# Patient Record
Sex: Male | Born: 1935 | Race: White | Hispanic: No | Marital: Married | Smoking: Never smoker
Health system: Southern US, Community
[De-identification: ages and names within clinical notes are randomized; demographics above are authoritative.]

## PROBLEM LIST (undated history)

## (undated) DIAGNOSIS — I639 Cerebral infarction, unspecified: Secondary | ICD-10-CM

## (undated) DIAGNOSIS — L570 Actinic keratosis: Secondary | ICD-10-CM

## (undated) HISTORY — DX: Actinic keratosis: L57.0

## (undated) HISTORY — DX: Cerebral infarction, unspecified: I63.9

## (undated) HISTORY — PX: PROSTATE SURGERY: SHX751

---

## 2007-11-21 DIAGNOSIS — C4492 Squamous cell carcinoma of skin, unspecified: Secondary | ICD-10-CM

## 2007-11-21 HISTORY — DX: Squamous cell carcinoma of skin, unspecified: C44.92

## 2009-12-08 ENCOUNTER — Observation Stay: Payer: Self-pay | Admitting: Internal Medicine

## 2010-02-05 ENCOUNTER — Ambulatory Visit: Payer: Self-pay | Admitting: Ophthalmology

## 2010-02-25 ENCOUNTER — Ambulatory Visit: Payer: Self-pay | Admitting: Ophthalmology

## 2013-06-06 ENCOUNTER — Ambulatory Visit: Payer: Self-pay | Admitting: Internal Medicine

## 2014-10-15 ENCOUNTER — Ambulatory Visit
Admission: RE | Admit: 2014-10-15 | Discharge: 2014-10-15 | Disposition: A | Discharge status: Home / Self Care | Payer: Medicare HMO | Source: Ambulatory Visit | Attending: Internal Medicine | Admitting: Internal Medicine

## 2014-10-15 ENCOUNTER — Other Ambulatory Visit: Payer: Self-pay | Admitting: Internal Medicine

## 2014-10-15 DIAGNOSIS — M542 Cervicalgia: Secondary | ICD-10-CM

## 2014-10-23 ENCOUNTER — Ambulatory Visit: Payer: Medicare HMO | Attending: Internal Medicine | Admitting: Physical Therapy

## 2014-10-23 ENCOUNTER — Encounter: Payer: Self-pay | Admitting: Physical Therapy

## 2014-10-23 DIAGNOSIS — M542 Cervicalgia: Secondary | ICD-10-CM | POA: Insufficient documentation

## 2014-10-23 DIAGNOSIS — M545 Low back pain, unspecified: Secondary | ICD-10-CM

## 2014-10-23 NOTE — Therapy (Signed)
[place] MAIN [facility] SERVICES [address] [place], [place], [address] Phone: [phone]   Fax:  [phone]  Physical Therapy Evaluation  Patient Details  Name: Chase George MRN: [id number] Date of Birth: Jun 06, 1933 Referring Provider:  Albina Billet, MD  Encounter Date: 10/23/2014      PT End of Session - 10/23/14 1116    Visit Number 1   Number of Visits 13   Date for PT Re-Evaluation 12/18/14   Authorization Type 1/10   PT Start Time 1000   PT Stop Time 1100   PT Time Calculation (min) 60 min   Activity Tolerance Patient tolerated treatment well   Behavior During Therapy [facility] for tasks assessed/performed      Past Medical History  Diagnosis Date  . Stroke 10 years ago    Mini stroke (TIA) no deficits    Past Surgical History  Procedure Laterality Date  . Prostate surgery      There were no vitals filed for this visit.  Visit Diagnosis:  Cervicalgia - Plan: PT plan of care cert/re-cert  Left-sided low back pain without sciatica - Plan: PT plan of care cert/re-cert  Neck pain on left side - Plan: PT plan of care cert/re-cert      Subjective Assessment - 10/23/14 1004    Subjective Patient reports he began feeling pain in his posterior flank on the left side, which has gradually elevated to his left shoulder and neck. His chief complaint now is that he is unable to turn his head. He states this has been going on for roughly 2 months, with no identifiable mechanism of injury.    Patient Stated Goals To be able to turn his head pain free.    Currently in Pain? Yes   Pain Score 5    Pain Location Neck   Pain Orientation Left   Pain Onset More than a month ago   Pain Frequency Intermittent   Aggravating Factors  Turning his head    Pain Relieving Factors Pain medications (ibuprofen)             [facility] PT Assessment - 10/23/14 1032    Assessment   Medical Diagnosis Cervical strain   Onset Date/Surgical Date 08/23/14    Hand Dominance Right   Precautions   Precautions None   Restrictions   Weight Bearing Restrictions No   Balance Screen   Has the patient fallen in the past 6 months No   Has the patient had a decrease in activity level because of a fear of falling?  No   Is the patient reluctant to leave their home because of a fear of falling?  No   Prior Function   Level of Independence Independent   Cognition   Overall Cognitive Status Within Functional Limits for tasks assessed   Observation/Other Assessments   Quick DASH  20  2 for all 4 responses on work module   Posture/Postural Control   Posture Comments Kyphotic cervical spine    Palpation   Spinal mobility --  Decreased give in t and c-spines on mobilizations   Palpation comment --  Tautness in cervical posterior spinal musculature   Special Tests    Special Tests Cervical   Cervical Tests Spurling's;Dictraction   Spurling's   Findings Negative   Distraction Test   Comment Felt better afterwards     There- Ex  Initially patient described pain with cervical flexion/extension and was limited by roughly 25% in ROM. Cervical  rotation patient described at end ranges, and was limited by roughly 25%, rotating to the left was more restricted and painful than rotating to the right.  Lateral glides applied bilaterally grade I-II mobilizations provided and well tolerated  1st rib mobilizations grade I-II provided bilaterally and well tolerated  Thoracic and cervical P-A mobilizations grade I-II, well tolerated  Red-theraband mid-rows x 12 with cuing for pinching his shoulder blades together.  Cervical distractions grade II-III provided in neutral and with side bending bilaterally, well tolerated  Soft tissue mobilization provided to the posterior cervical musculature, well tolerated  Active SCM stretching x 8 bilaterally with 5 second holds  Scalene stretching x 10 bilaterally with 5 second holds  Flexion with overpressure of cervical  spine x 10 with 3 second holds  Manual overpressure x 8 bilaterally with PNF stretching into cervical rotation, well tolerated Educated patient on squatting/lifting technique                       PT Education - 10/23/14 1116    Education provided Yes   Education Details Patient educated that he most likely has a cervical sprain and provided HEP and importance of compliance with HEP.    Person(s) Educated Patient   Methods Explanation;Demonstration;Handout   Comprehension Verbalized understanding;Returned demonstration             PT Long Term Goals - 10/23/14 1129    PT LONG TERM GOAL #1   Title Patient will be independent with a HEP to address postural weakness, increase ROM, and decrease pain by 12/18/2014.   Status New   PT LONG TERM GOAL #2   Title Patient will report a decrease in QuickDash score by at least 9 points to indicate increased function and decreased pain by 12/18/2014.    Baseline Score of 20   Status New   PT LONG TERM GOAL #3   Title Patient will display full AROM of cervical flexion/extension and rotation with no increase in pain to return to household activities by 12/18/2014.   Baseline AROM reudced 25% and painful at end ranges.   Status New   PT LONG TERM GOAL #4   Title Patient will report a worst VAS pain score of 1/10 to display increased activity tolerance for household activities by 12/18/2014.   Baseline 5/10   Status New               Plan - 10/23/14 1122    Clinical Impression Statement Patient is a 81 y/o male that presents with left sided cervical pain of insidious onset roughly 2 months ago. Patient displays kyphotic neck posture, poor lifting techniques, and significant tautness in her left posterior cervical musculature and upper trapezius. Patient responded well to manual techniques and soft tissue mobilization, increasing his ROM by roughly 25% in rotation and flexion/extension. He states he did not feel any pain at  the end of the session except with rotations. Patient had a Negative Spurling's test, and given his overall presentation this appears to be a muscular/fascial set of restrictions/overuse and should respond well to postural strengthening and manual techniques. Skilled PT services are indicated at this time to address the listed defiicts.    Pt will benefit from skilled therapeutic intervention in order to improve on the following deficits Pain;Decreased mobility;Improper body mechanics;Decreased range of motion   Rehab Potential Good   Clinical Impairments Affecting Rehab Potential Kyphotic cervical spine    PT Frequency 2x / week  PT Duration 6 weeks   PT Treatment/Interventions ADLs/Self Care Home Management;Traction;Dry needling;Taping;Therapeutic exercise;Therapeutic activities;Manual techniques   PT Next Visit Plan Re-assess HEP, continue with manual techniques if painful, assess low back pain, provide education on squatting and lifting techniques.    PT Home Exercise Plan See in patient instructions    Consulted and Agree with Plan of Care Patient          G-Codes - 2014-11-11 1121    Functional Assessment Tool Used QuickDash, clinical judgement    Functional Limitation Changing and maintaining body position   Changing and Maintaining Body Position Current Status (K3276) At least 1 percent but less than 20 percent impaired, limited or restricted   Changing and Maintaining Body Position Goal Status (D4709) 0 percent impaired, limited or restricted       Problem List There are no active problems to display for this patient.  Kerman Passey, PT, DPT   11/11/2014, 11:35 AM  [place] MAIN [facility] SERVICES [address] [place], [place], [address] Phone: [phone]   Fax:  [phone]

## 2014-10-23 NOTE — Patient Instructions (Signed)
All exercises provided were adapted from [email or web address]. Patient was provided a written handout with pictures as described. Any additional cues were manually entered in to handout and copied in to this document.   3 WAY SCAPULAR ROW - MIDDLE (12 times, 3 second holds, 2 sets, 2x per day)   Tie a knot in the elastic band and close the knot in the door jam. Perform this exercise with the elastic band at elbow level.  Hold an elastic band with both arms in front of you. Next, pull the band back towards your side as you bend your elbows.    Lateral Posterior Neck Stretch (10 times, 3 second holds, 1 set, 2x per day)   Place hand out on bed or chair. Look at arm armpit and place opposite hand on head and apply slight overpressure.   Neck Self Mobilization (10 times, 5 second holds, 2 sets, 2x per day)   - Sit upright, in good thoracic and cervical alignment - Place the edge of a towel near the most stiff and painful part of your neck - Use one hand to stabilize the towel down along your chest, with your other hand pull the towel forward and upward causing the neck to rotate in and the other to rotate you neck into the direction that is most restricted,  having the hand pulling the towel guide the motion - Watch out for trunk rotation that might occur as an incorrect substitution pattern

## 2014-10-29 ENCOUNTER — Ambulatory Visit: Payer: Medicare HMO | Admitting: Physical Therapy

## 2014-10-29 ENCOUNTER — Encounter: Payer: Self-pay | Admitting: Physical Therapy

## 2014-10-29 DIAGNOSIS — M545 Low back pain, unspecified: Secondary | ICD-10-CM

## 2014-10-29 DIAGNOSIS — M542 Cervicalgia: Secondary | ICD-10-CM | POA: Diagnosis not present

## 2014-10-29 NOTE — Therapy (Signed)
[place] MAIN [facility] SERVICES [address] [place], [place], [address] Phone: [phone]   Fax:  [phone]  Physical Therapy Treatment  Patient Details  Name: Chase George MRN: [id number] Date of Birth: [birth year] Referring Provider:  Albina Billet, MD  Encounter Date: 10/29/2014      PT End of Session - 10/29/14 1435    Visit Number 2   Number of Visits 13   Date for PT Re-Evaluation 12/18/14   Authorization Type 2/10    PT Start Time 1348   PT Stop Time 1430   PT Time Calculation (min) 42 min   Activity Tolerance Patient tolerated treatment well   Behavior During Therapy [facility] for tasks assessed/performed      Past Medical History  Diagnosis Date  . Stroke 10 years ago    Mini stroke (TIA) no deficits    Past Surgical History  Procedure Laterality Date  . Prostate surgery      There were no vitals filed for this visit.  Visit Diagnosis:  Cervicalgia  Left-sided low back pain without sciatica  Neck pain on left side      Subjective Assessment - 10/29/14 1350    Subjective Patient reports that he has had intermittent improvements with his symptoms. He is afraid he has not been able to complete his HEP appropriately. He continues to complain of neck pain and reduced AROM.    Limitations --  Turning his head while driving   Patient Stated Goals To be able to turn his head pain free.    Currently in Pain? Yes   Pain Score 6    Pain Location Neck   Pain Orientation Medial   Pain Descriptors / Indicators Aching   Pain Type Chronic pain   Pain Onset More than a month ago   Pain Frequency Intermittent   Aggravating Factors  Turning his head    Pain Relieving Factors Pain medications (ibuprofen)    Effect of Pain on Daily Activities Makes it difficult for him to drive       Manual therapy  Cervical distractions provided grade I-II in straight line plane and with side bending bilaterally with mild relief of symptoms   Lateral to medial grade II mobilizations provided to cervical spine in supine  Soft tissue mobilization provided to levator scapulae and cervical spine with relief of pain, though minimal.  1st rib mobilization grade I-II provided bilaterally, with increase in ROM but no decrease in pain P-A mobilizations grade II-IV for pain/stiffness in lumbar, cervical, and thoracic spines with significant increase in ROM afterwards with side rotation.   There-Ex  Cervical retractions x 10 for 2 sets for 3 second holds (no decrease in pain SCM stretching x 12 bilaterally, increased ROM, though no decrease in pain  PNF overpressure to left rotation x 10 with 15 second holds, minimal change in symptoms Scalene stretching x 8 bilaterally                        PT Education - 10/29/14 1434    Education provided Yes   Education Details Patient educated to start using heat prior to exercises and to change his HEP to the one listed today.    Person(s) Educated Patient   Methods Explanation;Demonstration;Handout   Comprehension Verbalized understanding;Returned demonstration             PT Long Term Goals - 10/23/14 1129    PT LONG TERM GOAL #1  Title Patient will be independent with a HEP to address postural weakness, increase ROM, and decrease pain by 12/18/2014.   Status New   PT LONG TERM GOAL #2   Title Patient will report a decrease in QuickDash score by at least 9 points to indicate increased function and decreased pain by 12/18/2014.    Baseline Score of 20   Status New   PT LONG TERM GOAL #3   Title Patient will display full AROM of cervical flexion/extension and rotation with no increase in pain to return to household activities by 12/18/2014.   Baseline AROM reudced 25% and painful at end ranges.   Status New   PT LONG TERM GOAL #4   Title Patient will report a worst VAS pain score of 1/10 to display increased activity tolerance for household activities by 12/18/2014.    Baseline 5/10   Status New               Plan - 10/29/14 1436    Clinical Impression Statement Patient is able to move his head quickly through roughly 45 degrees of rotation bilaterally with no complaints, and is now experiencing pain only with end range rotation bilaterally. Patient is experiencing pain in the musculature between the transition of the thoracicc and cervical spine, in the area of the levator scapulae. Patient has no limitations in flexion/extension currently with no pain. Patient seems to respond better to manual techniques than exercise at this time, though patient did not seem to have any decrease in pain symptoms in his neck today. Patient instructed to discontinue towel exercise and instead try hot packs at home prior to beginning his stretches. Skilled PT services are indicated to continue to address his deficits in mobility.    Pt will benefit from skilled therapeutic intervention in order to improve on the following deficits Pain;Decreased mobility;Improper body mechanics;Decreased range of motion   Rehab Potential Good   Clinical Impairments Affecting Rehab Potential Kyphotic cervical spine    PT Frequency 2x / week   PT Duration 6 weeks   PT Treatment/Interventions ADLs/Self Care Home Management;Traction;Dry needling;Taping;Therapeutic exercise;Therapeutic activities;Manual techniques   PT Next Visit Plan Re-assess HEP, manual techniques if patient continues to be painful, provide education on squatting/lifting techniques.    PT Home Exercise Plan See in patient instructions    Consulted and Agree with Plan of Care Patient        Problem List There are no active problems to display for this patient.  Kerman Passey, PT, DPT   10/29/2014, 5:07 PM  [place] MAIN [facility] SERVICES [address] [place], [place], [address] Phone: [phone]   Fax:  [phone]

## 2014-10-31 ENCOUNTER — Ambulatory Visit: Payer: Medicare HMO | Admitting: Physical Therapy

## 2014-10-31 ENCOUNTER — Encounter: Payer: Self-pay | Admitting: Physical Therapy

## 2014-10-31 DIAGNOSIS — M542 Cervicalgia: Secondary | ICD-10-CM | POA: Diagnosis not present

## 2014-10-31 DIAGNOSIS — M545 Low back pain, unspecified: Secondary | ICD-10-CM

## 2014-11-01 NOTE — Therapy (Signed)
[place] MAIN [facility] SERVICES [address] [place], [place], [address] Phone: [phone]   Fax:  [phone]  Physical Therapy Treatment  Patient Details  Name: Chase George MRN: [id number] Date of Birth: 12/16/1933 Referring Provider:  Albina Billet, MD  Encounter Date: 10/31/2014      PT End of Session - 11/01/14 0804    Visit Number 3   Number of Visits 13   Date for PT Re-Evaluation 12/18/14   Authorization Type 3/10   PT Start Time 1039   PT Stop Time 1117   PT Time Calculation (min) 38 min   Activity Tolerance Patient tolerated treatment well   Behavior During Therapy [facility] for tasks assessed/performed      Past Medical History  Diagnosis Date  . Stroke 10 years ago    Mini stroke (TIA) no deficits    Past Surgical History  Procedure Laterality Date  . Prostate surgery      There were no vitals filed for this visit.  Visit Diagnosis:  Cervicalgia  Left-sided low back pain without sciatica  Neck pain on left side      Subjective Assessment - 10/31/14 1046    Subjective Patient states he has been compliant with his HEP and has noticed more ROM with rotation, but continues to complain of pain with end range cervical rotation.    Limitations --  Turning his head while driving.    Patient Stated Goals To be able to turn his head pain free.    Currently in Pain? No/denies  At rest no pain, he only experiences pain with end range rotation.        There-Ex  Instructed and observed patient in squat technique to pick up objects off of the floor x 8 repetitions. Patient required cuing for increased hip and knee flexion, and less lumbar flexion. Patient benefitted from cuing for more vertical trunk.    Manual Therapy 1st rib mobilization grade I-II bilaterally Soft tissue mobilization provided to levator scapulae and upper trapezius regions  Manual cervical distraction grade I-II with head in neutral and lateral flexion  bilaterally  Thoracic and c-spine mobilizations grade II-III, well tolerated  PNF manual over pressure in cervical rotations x 30 second holds x 3 bilaterally  Cervical retraction with PT overpressure grade I-II, well tolerated                            PT Education - 10/31/14 1203    Education provided Yes   Education Details Patient educated to start using hot pack and continue with HEP.    Person(s) Educated Patient   Methods Demonstration;Explanation   Comprehension Verbalized understanding;Returned demonstration             PT Long Term Goals - 10/23/14 1129    PT LONG TERM GOAL #1   Title Patient will be independent with a HEP to address postural weakness, increase ROM, and decrease pain by 12/18/2014.   Status New   PT LONG TERM GOAL #2   Title Patient will report a decrease in QuickDash score by at least 9 points to indicate increased function and decreased pain by 12/18/2014.    Baseline Score of 20   Status New   PT LONG TERM GOAL #3   Title Patient will display full AROM of cervical flexion/extension and rotation with no increase in pain to return to household activities by 12/18/2014.   Baseline AROM  reudced 25% and painful at end ranges.   Status New   PT LONG TERM GOAL #4   Title Patient will report a worst VAS pain score of 1/10 to display increased activity tolerance for household activities by 12/18/2014.   Baseline 5/10   Status New               Plan - 11/01/14 0805    Clinical Impression Statement Patient displays full AROM with all cervical motions, and complains of pain only at the end range of cervical rotations. Patient continues to state he gains more ROM durin gthe session, but has no change in the quality or intensity of his symptoms. PT may want to consider dry needling, or more treatments with his head in end range rotations. Skilled PT services are indicated to address his pain with turning his head while driving.    Pt  will benefit from skilled therapeutic intervention in order to improve on the following deficits Pain;Decreased mobility;Improper body mechanics;Decreased range of motion   Rehab Potential Good   Clinical Impairments Affecting Rehab Potential Kyphotic cervical spine    PT Frequency 2x / week   PT Duration 6 weeks   PT Treatment/Interventions ADLs/Self Care Home Management;Traction;Dry needling;Taping;Therapeutic exercise;Therapeutic activities;Manual techniques   PT Next Visit Plan Progress HEP, may want to begin more active exercises. Consider manual traction with head in rotations and/or soft tissue mobilization with head in rotation.    PT Home Exercise Plan Continued from previous sessions.    Consulted and Agree with Plan of Care Patient        Problem List There are no active problems to display for this patient.  Kerman Passey, PT, DPT   11/01/2014, 8:11 AM  [place] MAIN [facility] SERVICES [address] [place], [place], [address] Phone: [phone]   Fax:  [phone]

## 2014-11-05 ENCOUNTER — Ambulatory Visit: Payer: Medicare HMO | Admitting: Physical Therapy

## 2014-11-05 ENCOUNTER — Encounter: Payer: Self-pay | Admitting: Physical Therapy

## 2014-11-05 DIAGNOSIS — M542 Cervicalgia: Secondary | ICD-10-CM

## 2014-11-05 DIAGNOSIS — M545 Low back pain, unspecified: Secondary | ICD-10-CM

## 2014-11-05 NOTE — Therapy (Signed)
[place] MAIN [facility] SERVICES [address] [place], [place], [address] Phone: [phone]   Fax:  [phone]  Physical Therapy Treatment/ DC summary  Patient Details  Name: Chase George MRN: [id number] Date of Birth: January 31, 1934 Referring Provider:  Albina Billet, MD  Encounter Date: 2014-11-30      PT End of Session - 11-30-14 1135    PT Start Time 1100   PT Stop Time 1140   PT Time Calculation (min) 40 min      Past Medical History  Diagnosis Date  . Stroke 10 years ago    Mini stroke (TIA) no deficits    Past Surgical History  Procedure Laterality Date  . Prostate surgery      There were no vitals filed for this visit.  Visit Diagnosis:  Cervicalgia  Left-sided low back pain without sciatica  Neck pain on left side      Subjective Assessment - 11/30/14 1110    Subjective Patietnt says that most of his pain is goine today.       Cervical retractions x 10 for 2 sets for 3 second holds  SCM stretching x 12 bilaterally, increased ROM, though no decrease in pain  PNF overpressure to left rotation x 10 with 15 second holds, minimal change in symptoms Scalene stretching x 8 bilaterally  Manual therapy including manual traction to cervical spine x 8 minutes and PA glides grade 2 C 4,5,6 Patient reports no pain except in end ranges of rotation left and right.                                 PT Long Term Goals - 2014-11-30 1134    PT LONG TERM GOAL #1   Status Achieved   PT LONG TERM GOAL #2   Status Achieved   PT LONG TERM GOAL #3   Status Achieved   PT LONG TERM GOAL #4   Status Achieved               Plan - 30-Nov-2014 1110    Clinical Impression Statement Patient has AROM WFL and reports that most of his pain is goine. He will be DC from therapy and will continue HEP.Marland Kitchen   Pt will benefit from skilled therapeutic intervention in order to improve on the following deficits Pain;Decreased  mobility;Improper body mechanics;Decreased range of motion   Rehab Potential Good   Clinical Impairments Affecting Rehab Potential Kyphotic cervical spine    PT Frequency 2x / week   PT Duration 6 weeks   PT Treatment/Interventions ADLs/Self Care Home Management;Traction;Dry needling;Taping;Therapeutic exercise;Therapeutic activities;Manual techniques   PT Next Visit Plan Progress HEP, may want to begin more active exercises. Consider manual traction with head in rotations and/or soft tissue mobilization with head in rotation.    PT Home Exercise Plan Continued from previous sessions.    Consulted and Agree with Plan of Care Patient          G-Codes - November 30, 2014 1138    Changing and Maintaining Body Position Goal Status [phone]) 0 percent impaired, limited or restricted      Problem List There are no active problems to display for this patient.   Alanson Puls 11-30-2014, 11:39 AM  [place] MAIN [facility] SERVICES [address] [place], [place], [address] Phone: [phone]   Fax:  [phone]

## 2014-11-07 ENCOUNTER — Encounter: Payer: Medicare HMO | Admitting: Physical Therapy

## 2014-11-12 ENCOUNTER — Encounter: Payer: Medicare HMO | Admitting: Physical Therapy

## 2014-11-14 ENCOUNTER — Encounter: Payer: Medicare HMO | Admitting: Physical Therapy

## 2014-11-19 ENCOUNTER — Encounter: Payer: Medicare HMO | Admitting: Physical Therapy

## 2014-11-21 ENCOUNTER — Encounter: Payer: Medicare HMO | Admitting: Physical Therapy

## 2017-10-18 ENCOUNTER — Other Ambulatory Visit: Payer: Self-pay | Admitting: Internal Medicine

## 2017-10-18 ENCOUNTER — Ambulatory Visit
Admission: RE | Admit: 2017-10-18 | Discharge: 2017-10-18 | Disposition: A | Discharge status: Home / Self Care | Payer: Medicare HMO | Source: Ambulatory Visit | Attending: Internal Medicine | Admitting: Internal Medicine

## 2017-10-18 DIAGNOSIS — M25511 Pain in right shoulder: Secondary | ICD-10-CM

## 2017-10-18 DIAGNOSIS — R911 Solitary pulmonary nodule: Secondary | ICD-10-CM | POA: Insufficient documentation

## 2017-10-24 ENCOUNTER — Ambulatory Visit
Admission: RE | Admit: 2017-10-24 | Discharge: 2017-10-24 | Disposition: A | Discharge status: Home / Self Care | Payer: Medicare HMO | Source: Ambulatory Visit | Attending: Internal Medicine | Admitting: Internal Medicine

## 2017-10-24 ENCOUNTER — Other Ambulatory Visit: Payer: Self-pay | Admitting: Internal Medicine

## 2017-10-24 DIAGNOSIS — R911 Solitary pulmonary nodule: Secondary | ICD-10-CM | POA: Insufficient documentation

## 2019-02-06 IMAGING — DX DG SHOULDER 2+V*R*
4 series · 4 of 4 positions shown · non-contrast
Comparison: None.

CLINICAL DATA: Right shoulder pain for 3 weeks.

EXAM:
RIGHT SHOULDER - 2+ VIEW

[shoulder ap]
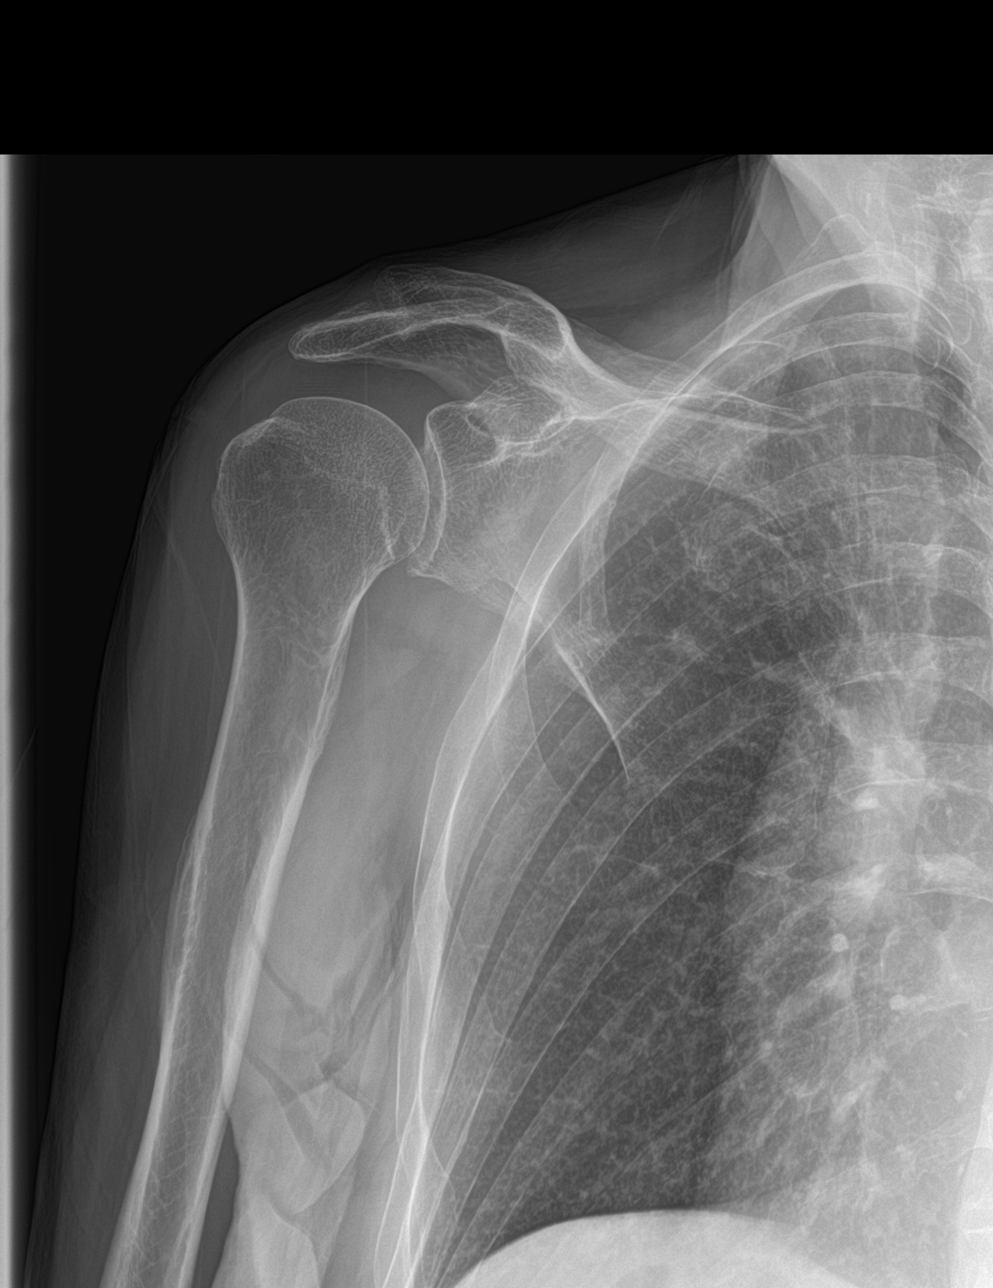

[shoulder y-view (1 of 2)]
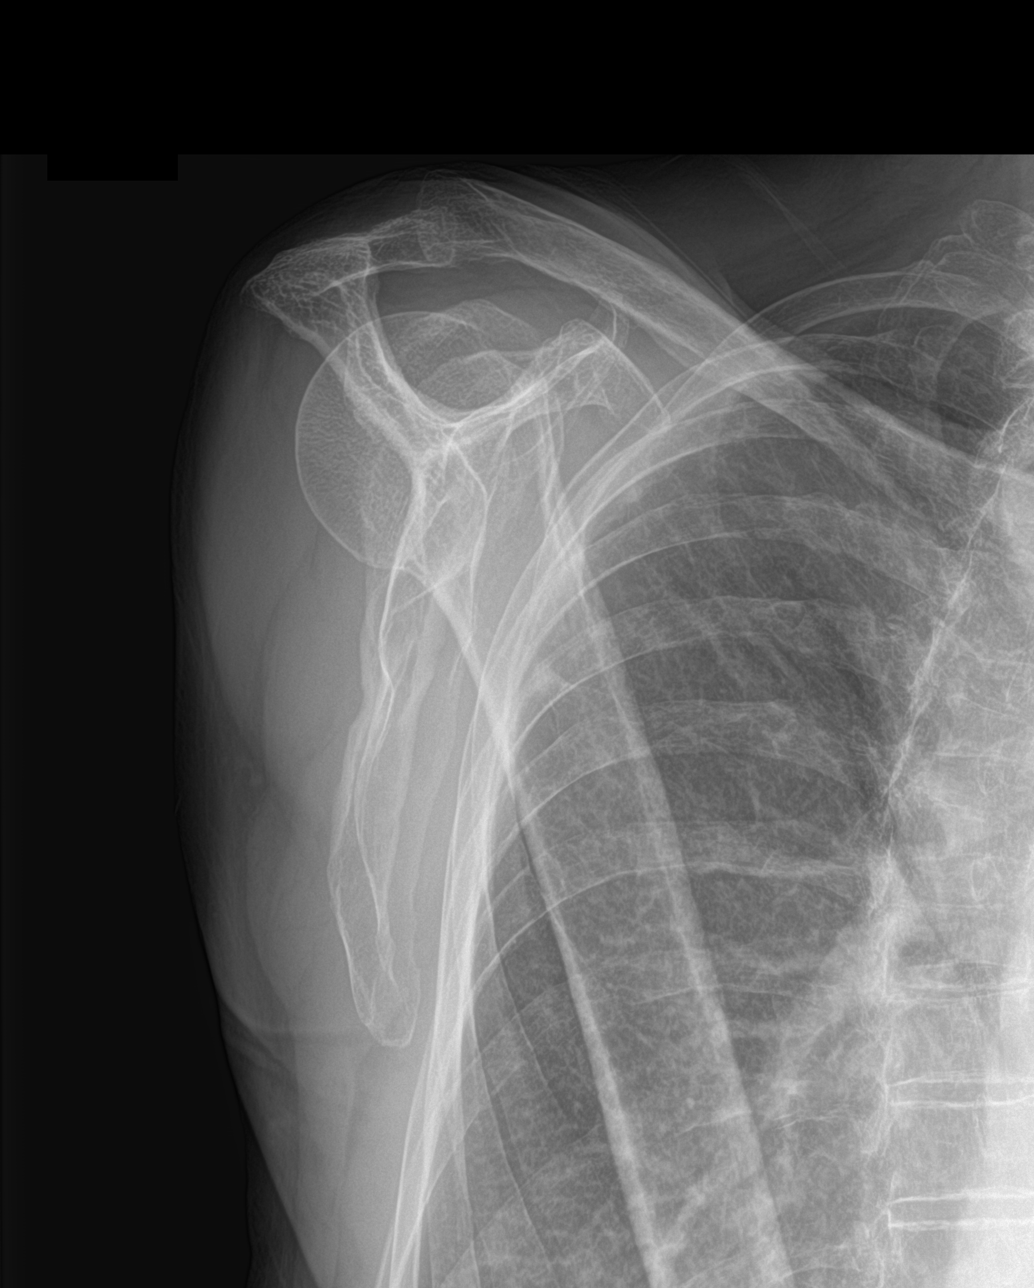

[shoulder axial]
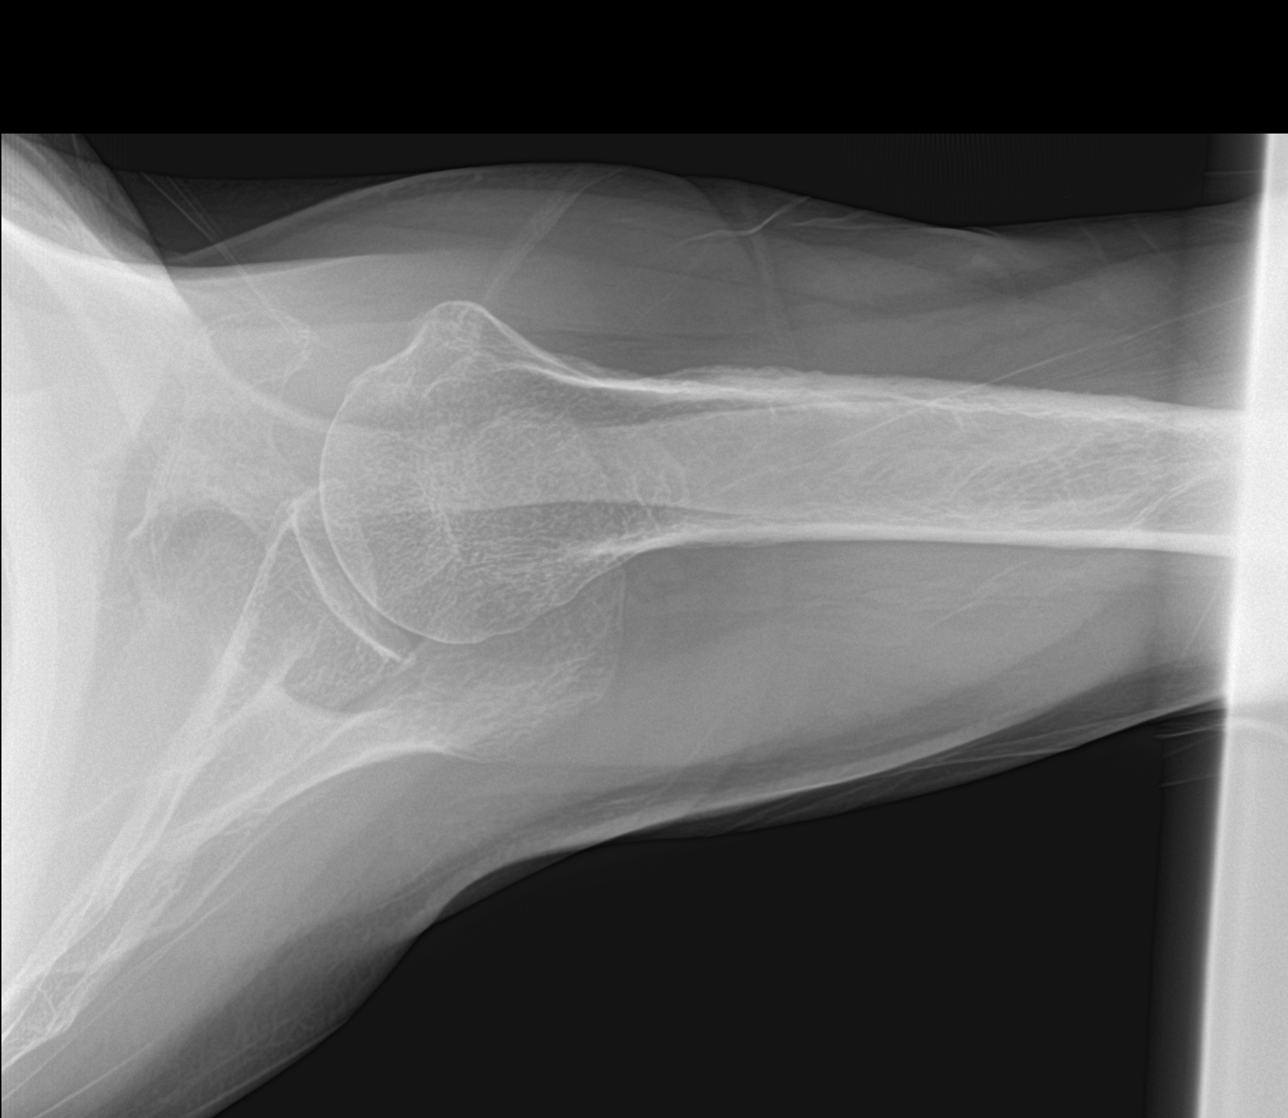

[shoulder y-view (2 of 2)]
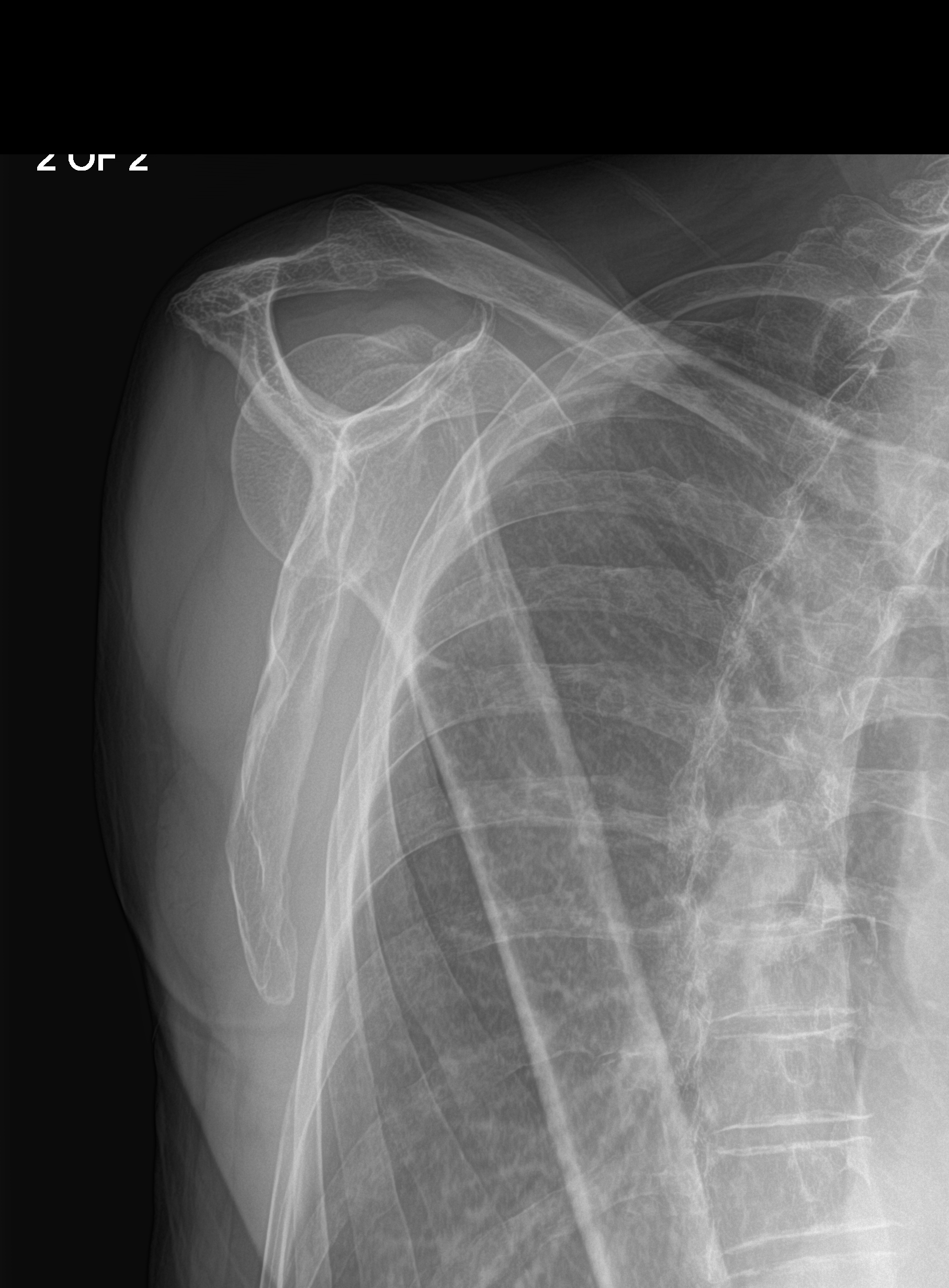

[4 of 4 positions shown; findings below may reference images not displayed]

FINDINGS: There is no evidence of fracture or dislocation. There is no
evidence of arthropathy or other focal bone abnormality. Soft
tissues are unremarkable.

Small pulmonary nodule identified in the right upper lobe.
IMPRESSION: 1. Small pulmonary nodule identified right upper lobe. CT chest
without contrast recommended to further evaluate.
2. No acute bony abnormality in the right shoulder.
3. These results will be called to the ordering clinician or
representative by the Radiologist Assistant, and communication
documented in the PACS or zVision Dashboard.

## 2019-02-12 IMAGING — DX DG CHEST 2V
3 series · 3 of 3 positions shown · non-contrast
Comparison: 12/08/2009.  Right shoulder series 10/18/2017

CLINICAL DATA: Right upper lobe nodule

EXAM:
CHEST - 2 VIEW

[chest pa (1 of 2)]
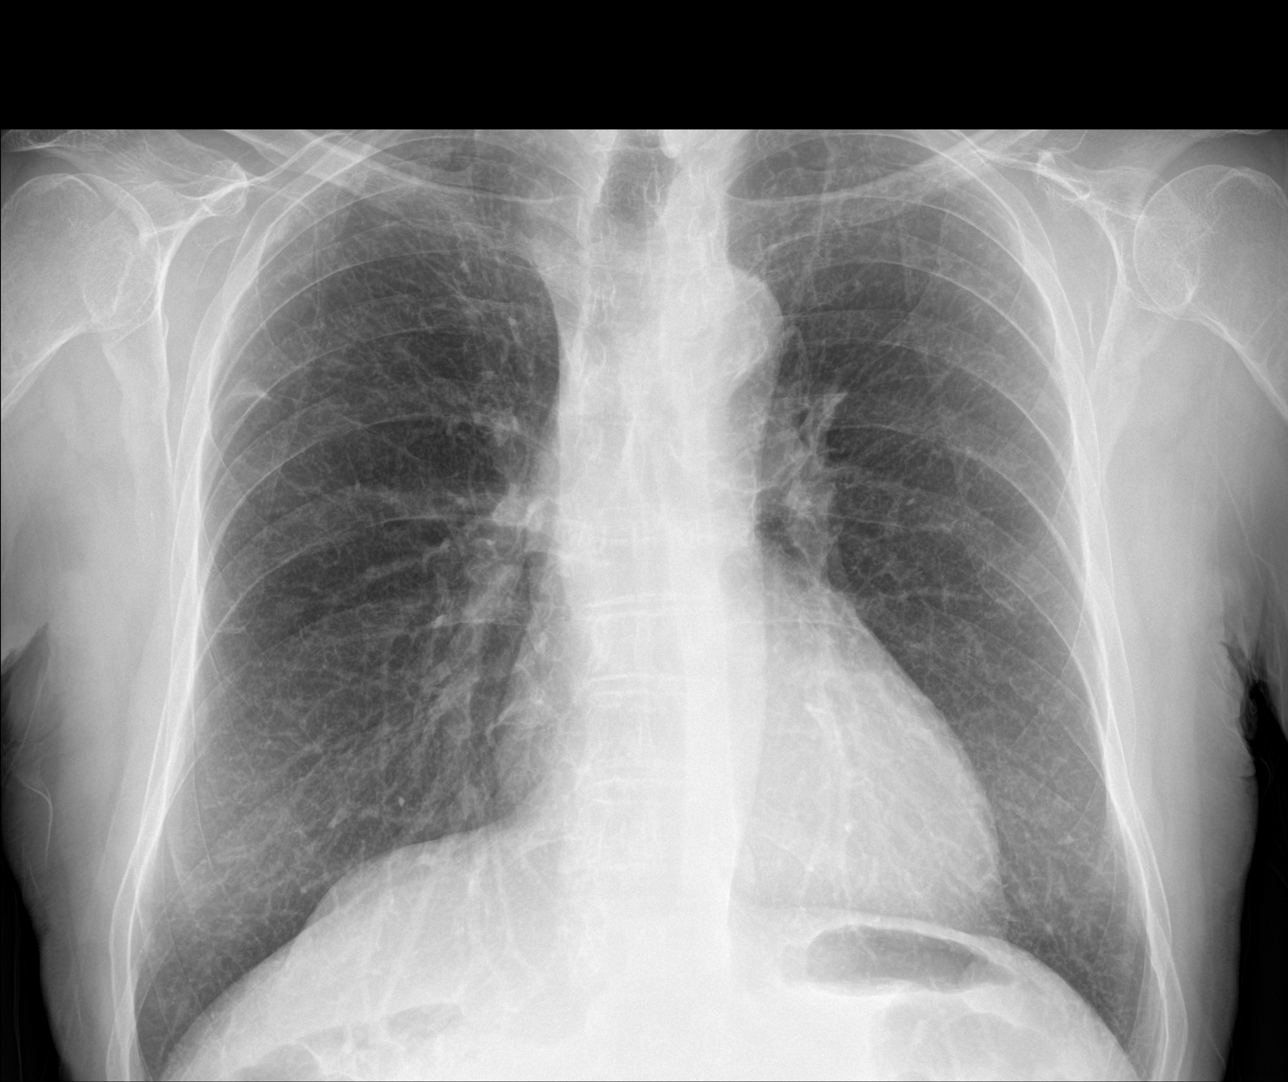

[chest lat]
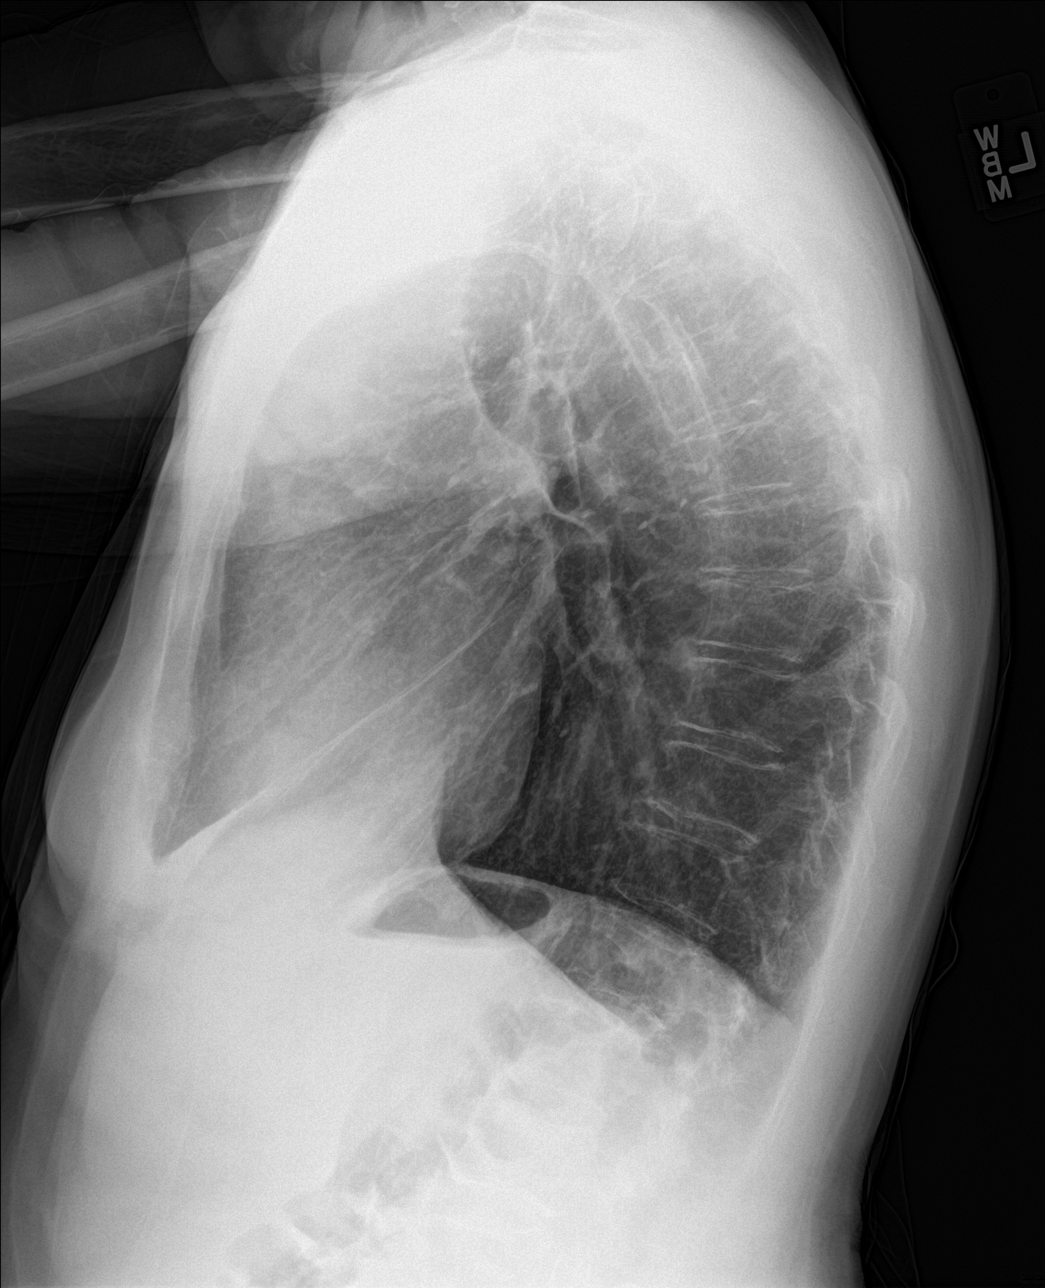

[chest pa (2 of 2)]
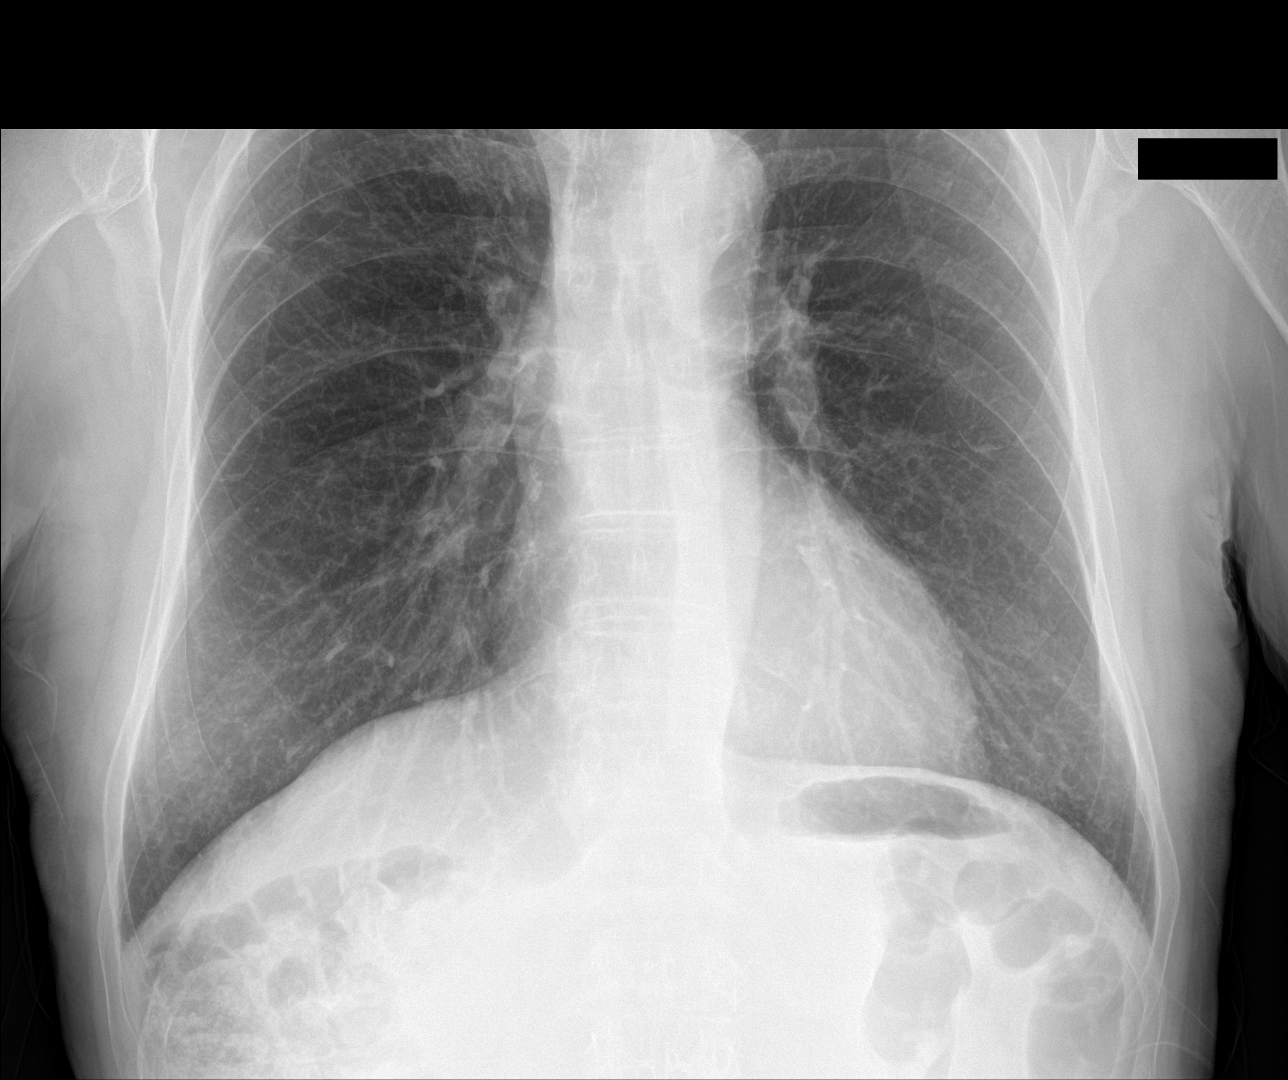

[3 of 3 positions shown; findings below may reference images not displayed]

FINDINGS: Small nodule noted peripherally in the right upper lobe is stable
since 9644, likely scarring. Heart is normal size. No confluent
airspace opacities or effusions. No acute bony abnormality.
IMPRESSION: Nodule in the right upper lobe again noted as seen on right shoulder
series, but appears stable dating back to 9644 most compatible with
scarring.

## 2019-10-29 ENCOUNTER — Other Ambulatory Visit: Payer: Self-pay

## 2019-10-29 ENCOUNTER — Ambulatory Visit (INDEPENDENT_AMBULATORY_CARE_PROVIDER_SITE_OTHER): Payer: Medicare HMO | Admitting: Dermatology

## 2019-10-29 DIAGNOSIS — D229 Melanocytic nevi, unspecified: Secondary | ICD-10-CM

## 2019-10-29 DIAGNOSIS — D692 Other nonthrombocytopenic purpura: Secondary | ICD-10-CM

## 2019-10-29 DIAGNOSIS — Z1283 Encounter for screening for malignant neoplasm of skin: Secondary | ICD-10-CM

## 2019-10-29 DIAGNOSIS — L720 Epidermal cyst: Secondary | ICD-10-CM | POA: Diagnosis not present

## 2019-10-29 DIAGNOSIS — L820 Inflamed seborrheic keratosis: Secondary | ICD-10-CM

## 2019-10-29 DIAGNOSIS — D1800 Hemangioma unspecified site: Secondary | ICD-10-CM | POA: Diagnosis not present

## 2019-10-29 DIAGNOSIS — L570 Actinic keratosis: Secondary | ICD-10-CM

## 2019-10-29 DIAGNOSIS — L814 Other melanin hyperpigmentation: Secondary | ICD-10-CM

## 2019-10-29 DIAGNOSIS — L578 Other skin changes due to chronic exposure to nonionizing radiation: Secondary | ICD-10-CM

## 2019-10-29 DIAGNOSIS — L821 Other seborrheic keratosis: Secondary | ICD-10-CM

## 2019-10-29 NOTE — Progress Notes (Addendum)
Follow-Up Visit   Subjective  Chase George is a 86 y.o. male who presents for the following: Annual Exam (UBSE - patient has noticed a tender pink patch on his right cheek that is sensitive when he shaves over it). The patient presents for Upper Body Skin Exam (UBSE) for skin cancer screening and mole check.  The following portions of the chart were reviewed this encounter and updated as appropriate:  Tobacco   Allergies   Meds   Problems   Med Hx   Surg Hx   Fam Hx       Review of Systems:  No other skin or systemic complaints except as noted in HPI or Assessment and Plan.  Objective  Well appearing patient in no apparent distress; mood and affect are within normal limits.  All skin waist up examined.  Objective  Face, scalp, and hands x 13 (13): Erythematous thin papules/macules with gritty scale.   Objective  Face x 3, hands x 2 (5): Erythematous keratotic or waxy stuck-on papule or plaque.   Objective  Glabella: Smooth white papule(s).   Assessment & Plan    AK (actinic keratosis) (13) Face, scalp, and hands x 13  Destruction of lesion - Face, scalp, and hands x 13 Complexity: simple   Destruction method: cryotherapy   Informed consent: discussed and consent obtained   Timeout:  patient name, date of birth, surgical site, and procedure verified Lesion destroyed using liquid nitrogen: Yes   Region frozen until ice ball extended beyond lesion: Yes   Outcome: patient tolerated procedure well with no complications   Post-procedure details: wound care instructions given    Inflamed seborrheic keratosis (5) Face x 3, hands x 2  Destruction of lesion - Face x 3, hands x 2 Complexity: simple   Destruction method: cryotherapy   Timeout:  patient name, date of birth, surgical site, and procedure verified Lesion destroyed using liquid nitrogen: Yes   Region frozen until ice ball extended beyond lesion: Yes   Outcome: patient tolerated procedure well with no  complications   Post-procedure details: wound care instructions given    Milia Glabella  Benign, observe.    Skin cancer screening   Lentigines - Scattered tan macules - Discussed due to sun exposure - Benign, observe - Call for any changes  Seborrheic Keratoses - Stuck-on, waxy, tan-brown papules and plaques  - Discussed benign etiology and prognosis. - Observe - Call for any changes  Melanocytic Nevi - Tan-brown and/or pink-flesh-colored symmetric macules and papules - Benign appearing on exam today - Observation - Call clinic for new or changing moles - Recommend daily use of broad spectrum spf 30+ sunscreen to sun-exposed areas.   Hemangiomas - Red papules - Discussed benign nature - Observe - Call for any changes  Actinic Damage - diffuse scaly erythematous macules with underlying dyspigmentation - Recommend daily broad spectrum sunscreen SPF 30+ to sun-exposed areas, reapply every 2 hours as needed.  - Call for new or changing lesions.  Purpura - Violaceous macules and patches - Benign - Related to age, sun damage and/or use of blood thinners - Observe - Can use OTC arnica containing moisturizer such as Dermend Bruise Formula if desired - Call for worsening or other concerns   Skin cancer screening performed today.   Return in about 6 months (around 04/29/2020) for F/U appt. - sun exposed areas.  [name], CMA, am acting as scribe for Sarina Ser, MD .  Documentation: I have reviewed the above  documentation for accuracy and completeness, and I agree with the above.  Sarina Ser, MD

## 2019-10-30 ENCOUNTER — Encounter: Payer: Self-pay | Admitting: Dermatology

## 2020-05-26 ENCOUNTER — Encounter: Payer: Medicare HMO | Admitting: Dermatology

## 2020-06-23 ENCOUNTER — Ambulatory Visit: Payer: Medicare HMO | Admitting: Dermatology

## 2020-06-23 ENCOUNTER — Other Ambulatory Visit: Payer: Self-pay

## 2020-06-23 DIAGNOSIS — L578 Other skin changes due to chronic exposure to nonionizing radiation: Secondary | ICD-10-CM | POA: Diagnosis not present

## 2020-06-23 DIAGNOSIS — L570 Actinic keratosis: Secondary | ICD-10-CM | POA: Diagnosis not present

## 2020-06-23 DIAGNOSIS — L820 Inflamed seborrheic keratosis: Secondary | ICD-10-CM

## 2020-06-23 DIAGNOSIS — L821 Other seborrheic keratosis: Secondary | ICD-10-CM

## 2020-06-23 NOTE — Progress Notes (Unsigned)
   Follow-Up Visit   Subjective  Chase George is a 87 y.o. male who presents for the following: Actinic Keratosis (Check sun exposed areas for new or persistent skin lesions).  The following portions of the chart were reviewed this encounter and updated as appropriate:   Tobacco  Allergies  Meds  Problems  Med Hx  Surg Hx  Fam Hx     Review of Systems:  No other skin or systemic complaints except as noted in HPI or Assessment and Plan.  Objective  Well appearing patient in no apparent distress; mood and affect are within normal limits.  A focused examination was performed including the face, scalp, and hands. Relevant physical exam findings are noted in the Assessment and Plan.  Objective  Sclap x 2, R mandible x 1, R zygoma x 1, L sideburn x 1 (5): Erythematous thin papules/macules with gritty scale.   Objective  R temple x 2 (2): Erythematous keratotic or waxy stuck-on papule or plaque.   Assessment & Plan  AK (actinic keratosis) (5) Sclap x 2, R mandible x 1, R zygoma x 1, L sideburn x 1  Destruction of lesion - Sclap x 2, R mandible x 1, R zygoma x 1, L sideburn x 1 Complexity: simple   Destruction method: cryotherapy   Informed consent: discussed and consent obtained   Timeout:  patient name, date of birth, surgical site, and procedure verified Lesion destroyed using liquid nitrogen: Yes   Region frozen until ice ball extended beyond lesion: Yes   Outcome: patient tolerated procedure well with no complications   Post-procedure details: wound care instructions given    Inflamed seborrheic keratosis (2) R temple x 2  Destruction of lesion - R temple x 2 Complexity: simple   Destruction method: cryotherapy   Informed consent: discussed and consent obtained   Timeout:  patient name, date of birth, surgical site, and procedure verified Lesion destroyed using liquid nitrogen: Yes   Region frozen until ice ball extended beyond lesion: Yes   Outcome: patient tolerated  procedure well with no complications   Post-procedure details: wound care instructions given     Actinic Damage - chronic, secondary to cumulative UV radiation exposure/sun exposure over time - diffuse scaly erythematous macules with underlying dyspigmentation - Recommend daily broad spectrum sunscreen SPF 30+ to sun-exposed areas, reapply every 2 hours as needed.  - Call for new or changing lesions.  Seborrheic Keratoses - Stuck-on, waxy, tan-brown papules and plaques  - Discussed benign etiology and prognosis. - Observe - Call for any changes  Return in about 6 months (around 12/21/2020) for AK follow up .  [name], CMA, am acting as scribe for Sarina Ser, MD .  Documentation: I have reviewed the above documentation for accuracy and completeness, and I agree with the above.  Sarina Ser, MD

## 2020-06-24 ENCOUNTER — Encounter: Payer: Self-pay | Admitting: Dermatology

## 2020-08-28 ENCOUNTER — Other Ambulatory Visit: Payer: Self-pay

## 2020-08-28 ENCOUNTER — Ambulatory Visit: Payer: Medicare HMO | Admitting: Dermatology

## 2020-08-28 DIAGNOSIS — L821 Other seborrheic keratosis: Secondary | ICD-10-CM

## 2020-08-28 DIAGNOSIS — R21 Rash and other nonspecific skin eruption: Secondary | ICD-10-CM | POA: Diagnosis not present

## 2020-08-28 DIAGNOSIS — L578 Other skin changes due to chronic exposure to nonionizing radiation: Secondary | ICD-10-CM | POA: Diagnosis not present

## 2020-08-28 DIAGNOSIS — L570 Actinic keratosis: Secondary | ICD-10-CM | POA: Diagnosis not present

## 2020-08-28 MED ORDER — CLOBETASOL PROPIONATE 0.05 % EX CREA: TOPICAL_CREAM | CUTANEOUS | 0 refills | Status: AC

## 2020-08-28 NOTE — Patient Instructions (Signed)

## 2020-08-28 NOTE — Progress Notes (Signed)
   Follow-Up Visit   Subjective  Chase George is a 87 y.o. male who presents for the following: Lesion (On the right arm - irritated and tender, patient is concerned and would it checked) and Rash (On the back that started about 3 weeks ago - patient c/o itching all over but especially on the back. He states he has had no changes in detergents, soaps, or medications).  The following portions of the chart were reviewed this encounter and updated as appropriate:   Tobacco  Allergies  Meds  Problems  Med Hx  Surg Hx  Fam Hx     Review of Systems:  No other skin or systemic complaints except as noted in HPI or Assessment and Plan.  Objective  Well appearing patient in no apparent distress; mood and affect are within normal limits.  A focused examination was performed including face, trunk, extremities. Relevant physical exam findings are noted in the Assessment and Plan.  Objective  Back, R arm: Edematous erythematous plaques and papules of the back.  Images    Objective  Face (16): Erythematous thin papules/macules with gritty scale.   Assessment & Plan  Rash and other nonspecific skin eruption Back, R arm  Contact dermatitis vs bite reaction vs other -   Start Clobetasol cream BID x 2 weeks. Topical steroids (such as triamcinolone, fluocinolone, fluocinonide, mometasone, clobetasol, halobetasol, betamethasone, hydrocortisone) can cause thinning and lightening of the skin if they are used for too long in the same area. Your physician has selected the right strength medicine for your problem and area affected on the body. Please use your medication only as directed by your physician to prevent side effects.   If rash doesn't improve at follow up recommend bx.  clobetasol cream (TEMOVATE) 0.05 % - Back, R arm  AK (actinic keratosis) (16) Face  Destruction of lesion - Face Complexity: simple   Destruction method: cryotherapy   Informed consent: discussed and consent  obtained   Timeout:  patient name, date of birth, surgical site, and procedure verified Lesion destroyed using liquid nitrogen: Yes   Region frozen until ice ball extended beyond lesion: Yes   Outcome: patient tolerated procedure well with no complications   Post-procedure details: wound care instructions given    Actinic Damage - chronic, secondary to cumulative UV radiation exposure/sun exposure over time - diffuse scaly erythematous macules with underlying dyspigmentation - Recommend daily broad spectrum sunscreen SPF 30+ to sun-exposed areas, reapply every 2 hours as needed.  - Recommend staying in the shade or wearing long sleeves, sun glasses (UVA+UVB protection) and wide brim hats (4-inch brim around the entire circumference of the hat). - Call for new or changing lesions.  Seborrheic Keratoses - Stuck-on, waxy, tan-brown papules and/or plaques  - Benign-appearing - Discussed benign etiology and prognosis. - Observe - Call for any changes  Return in about 2 weeks (around 09/11/2020) for rash follow up .  [name], CMA, am acting as scribe for Sarina Ser, MD .  Documentation: I have reviewed the above documentation for accuracy and completeness, and I agree with the above.  Sarina Ser, MD

## 2020-09-01 ENCOUNTER — Encounter: Payer: Self-pay | Admitting: Dermatology

## 2020-09-10 ENCOUNTER — Other Ambulatory Visit: Payer: Self-pay

## 2020-09-10 ENCOUNTER — Ambulatory Visit: Payer: Medicare HMO | Admitting: Dermatology

## 2020-09-10 DIAGNOSIS — C44622 Squamous cell carcinoma of skin of right upper limb, including shoulder: Secondary | ICD-10-CM | POA: Diagnosis not present

## 2020-09-10 DIAGNOSIS — R21 Rash and other nonspecific skin eruption: Secondary | ICD-10-CM

## 2020-09-10 DIAGNOSIS — L578 Other skin changes due to chronic exposure to nonionizing radiation: Secondary | ICD-10-CM

## 2020-09-10 DIAGNOSIS — D485 Neoplasm of uncertain behavior of skin: Secondary | ICD-10-CM

## 2020-09-10 NOTE — Progress Notes (Signed)
Follow-Up Visit   Subjective  Chase George is a 87 y.o. male who presents for the following: Follow-up (Contact derm vs bite reaction vs other back, right arm - back seems better but place on arm is very sore. Treating with Clobetasol cream).  The following portions of the chart were reviewed this encounter and updated as appropriate:   Tobacco  Allergies  Meds  Problems  Med Hx  Surg Hx  Fam Hx     Review of Systems:  No other skin or systemic complaints except as noted in HPI or Assessment and Plan.  Objective  Well appearing patient in no apparent distress; mood and affect are within normal limits.  A focused examination was performed including back, right arm. Relevant physical exam findings are noted in the Assessment and Plan.  Objective  Right Upper Arm - Posterior: 1.2 cm hyperkeratotic papule  Objective  Mid Back: Pink patch   Assessment & Plan  Neoplasm of uncertain behavior of skin Right Upper Arm - Posterior Epidermal / dermal shaving  Lesion diameter (cm):  1.2 Informed consent: discussed and consent obtained   Timeout: patient name, date of birth, surgical site, and procedure verified   Procedure prep:  Patient was prepped and draped in usual sterile fashion Prep type:  Isopropyl alcohol Anesthesia: the lesion was anesthetized in a standard fashion   Anesthetic:  1% lidocaine w/ epinephrine 1-100,000 buffered w/ 8.4% NaHCO3 Instrument used: flexible razor blade   Hemostasis achieved with: pressure, aluminum chloride and electrodesiccation   Outcome: patient tolerated procedure well   Post-procedure details: sterile dressing applied and wound care instructions given   Post-procedure details comment:  Ointment and small bandage applied Dressing type: bandage and petrolatum    Destruction of lesion Complexity: extensive   Destruction method: electrodesiccation and curettage   Informed consent: discussed and consent obtained   Timeout:  patient name,  date of birth, surgical site, and procedure verified Procedure prep:  Patient was prepped and draped in usual sterile fashion Prep type:  Isopropyl alcohol Anesthesia: the lesion was anesthetized in a standard fashion   Anesthetic:  1% lidocaine w/ epinephrine 1-100,000 buffered w/ 8.4% NaHCO3 Curettage performed in three different directions: Yes   Electrodesiccation performed over the curetted area: Yes   Lesion length (cm):  1.2 Lesion width (cm):  1.2 Margin per side (cm):  0.2 Final wound size (cm):  1.6 Hemostasis achieved with:  pressure and aluminum chloride Outcome: patient tolerated procedure well with no complications   Post-procedure details: sterile dressing applied and wound care instructions given   Dressing type: bandage and petrolatum    Specimen 1 - Surgical pathology Differential Diagnosis: SCC vs other  Check Margins: No 1.2 cm hyperkeratotic papule EDC today  Rash -bite reaction versus contact dermatitis improving Mid Back Improved but persistent; not to goal Continue Clobetasol cream bid x ~1 week  Actinic Damage - chronic, secondary to cumulative UV radiation exposure/sun exposure over time - diffuse scaly erythematous macules with underlying dyspigmentation - Recommend daily broad spectrum sunscreen SPF 30+ to sun-exposed areas, reapply every 2 hours as needed.  - Recommend staying in the shade or wearing long sleeves, sun glasses (UVA+UVB protection) and wide brim hats (4-inch brim around the entire circumference of the hat). - Call for new or changing lesions.  Return for Follow up as scheduled.  I, Ashok Cordia, CMA, am acting as scribe for Sarina Ser, MD .  Documentation: I have reviewed the above documentation for accuracy and completeness,  and I agree with the above.  Sarina Ser, MD

## 2020-09-10 NOTE — Patient Instructions (Signed)

## 2020-09-12 ENCOUNTER — Encounter: Payer: Self-pay | Admitting: Dermatology

## 2020-09-15 ENCOUNTER — Telehealth: Payer: Self-pay

## 2020-09-15 NOTE — Telephone Encounter (Signed)
LM on VM please call here  

## 2020-09-15 NOTE — Telephone Encounter (Signed)
Patient advised of bx results.  

## 2020-09-15 NOTE — Telephone Encounter (Signed)
-----   Message from Ralene Bathe, MD sent at 09/13/2020  5:30 PM EDT ----- Diagnosis Skin , right upper arm, posterior WELL DIFFERENTIATED SQUAMOUS CELL CARCINOMA, BASE INVOLVED  Cancer-SCC Already treated Recheck on follow-up

## 2021-01-05 ENCOUNTER — Other Ambulatory Visit: Payer: Self-pay

## 2021-01-05 ENCOUNTER — Ambulatory Visit: Payer: Medicare HMO | Admitting: Dermatology

## 2021-01-05 DIAGNOSIS — L820 Inflamed seborrheic keratosis: Secondary | ICD-10-CM

## 2021-01-05 DIAGNOSIS — L578 Other skin changes due to chronic exposure to nonionizing radiation: Secondary | ICD-10-CM | POA: Diagnosis not present

## 2021-01-05 DIAGNOSIS — L570 Actinic keratosis: Secondary | ICD-10-CM

## 2021-01-05 NOTE — Progress Notes (Signed)
   Follow-Up Visit   Subjective  Chase George is a 87 y.o. male who presents for the following: Skin Problem (4 months f/u check scaly places on his face ).  The following portions of the chart were reviewed this encounter and updated as appropriate:   Tobacco  Allergies  Meds  Problems  Med Hx  Surg Hx  Fam Hx     Review of Systems:  No other skin or systemic complaints except as noted in HPI or Assessment and Plan.  Objective  Well appearing patient in no apparent distress; mood and affect are within normal limits.  A focused examination was performed including face,scalp. Relevant physical exam findings are noted in the Assessment and Plan.  face,scalp (16) Erythematous thin papules/macules with gritty scale.   face x 2 (2) Erythematous keratotic or waxy stuck-on papule or plaque.    Assessment & Plan  AK (actinic keratosis) (16) face,scalp  Destruction of lesion - face,scalp Complexity: simple   Destruction method: cryotherapy   Informed consent: discussed and consent obtained   Timeout:  patient name, date of birth, surgical site, and procedure verified Lesion destroyed using liquid nitrogen: Yes   Region frozen until ice ball extended beyond lesion: Yes   Outcome: patient tolerated procedure well with no complications   Post-procedure details: wound care instructions given    Inflamed seborrheic keratosis face x 2  Destruction of lesion - face x 2 Complexity: simple   Destruction method: cryotherapy   Informed consent: discussed and consent obtained   Timeout:  patient name, date of birth, surgical site, and procedure verified Lesion destroyed using liquid nitrogen: Yes   Region frozen until ice ball extended beyond lesion: Yes   Outcome: patient tolerated procedure well with no complications   Post-procedure details: wound care instructions given    Actinic Damage - chronic, secondary to cumulative UV radiation exposure/sun exposure over time - diffuse  scaly erythematous macules with underlying dyspigmentation - Recommend daily broad spectrum sunscreen SPF 30+ to sun-exposed areas, reapply every 2 hours as needed.  - Recommend staying in the shade or wearing long sleeves, sun glasses (UVA+UVB protection) and wide brim hats (4-inch brim around the entire circumference of the hat). - Call for new or changing lesions.   Return in about 6 months (around 07/07/2021) for AKs, ISKs.  IMarye Round, CMA, am acting as scribe for Sarina Ser, MD .  Documentation: I have reviewed the above documentation for accuracy and completeness, and I agree with the above.  Sarina Ser, MD

## 2021-01-05 NOTE — Patient Instructions (Signed)

## 2021-01-06 ENCOUNTER — Encounter: Payer: Self-pay | Admitting: Dermatology

## 2021-07-06 ENCOUNTER — Other Ambulatory Visit: Payer: Self-pay

## 2021-07-06 ENCOUNTER — Ambulatory Visit: Payer: Medicare HMO | Admitting: Dermatology

## 2021-07-06 DIAGNOSIS — L570 Actinic keratosis: Secondary | ICD-10-CM

## 2021-07-06 DIAGNOSIS — D692 Other nonthrombocytopenic purpura: Secondary | ICD-10-CM

## 2021-07-06 DIAGNOSIS — L578 Other skin changes due to chronic exposure to nonionizing radiation: Secondary | ICD-10-CM | POA: Diagnosis not present

## 2021-07-06 NOTE — Patient Instructions (Signed)

## 2021-07-06 NOTE — Progress Notes (Signed)
° °  Follow-Up Visit   Subjective  Chase George is a 88 y.o. male who presents for the following: Actinic Keratosis (6 month follow up of face and scalp treated with LN2 x 16) and Other (Scaly spot of right brow). The patient has spots, moles and lesions to be evaluated, some may be new or changing and the patient has concerns that these could be cancer.  The following portions of the chart were reviewed this encounter and updated as appropriate:   Tobacco   Allergies   Meds   Problems   Med Hx   Surg Hx   Fam Hx      Review of Systems:  No other skin or systemic complaints except as noted in HPI or Assessment and Plan.  Objective  Well appearing patient in no apparent distress; mood and affect are within normal limits.  A focused examination was performed including scalp, face, ears, arms. Relevant physical exam findings are noted in the Assessment and Plan.  Face, ears (8) Erythematous thin papules/macules with gritty scale. Hyperkeratotic papules of right lat brow.   Assessment & Plan   Actinic Damage - chronic, secondary to cumulative UV radiation exposure/sun exposure over time - diffuse scaly erythematous macules with underlying dyspigmentation - Recommend daily broad spectrum sunscreen SPF 30+ to sun-exposed areas, reapply every 2 hours as needed.  - Recommend staying in the shade or wearing long sleeves, sun glasses (UVA+UVB protection) and wide brim hats (4-inch brim around the entire circumference of the hat). - Call for new or changing lesions.  Purpura - Chronic; persistent and recurrent.  Treatable, but not curable. - Violaceous macules and patches - Benign - Related to trauma, age, sun damage and/or use of blood thinners, chronic use of topical and/or oral steroids - Observe - Can use OTC arnica containing moisturizer such as Dermend Bruise Formula if desired - Call for worsening or other concerns  AK (actinic keratosis) (8) Face, ears  Destruction of lesion - Face,  ears Complexity: simple   Destruction method: cryotherapy   Informed consent: discussed and consent obtained   Timeout:  patient name, date of birth, surgical site, and procedure verified Lesion destroyed using liquid nitrogen: Yes   Region frozen until ice ball extended beyond lesion: Yes   Outcome: patient tolerated procedure well with no complications   Post-procedure details: wound care instructions given     Return in about 6 months (around 01/03/2022) for AK follow up.  I, Ashok Cordia, CMA, am acting as scribe for Sarina Ser, MD . Documentation: I have reviewed the above documentation for accuracy and completeness, and I agree with the above.  Sarina Ser, MD

## 2021-07-09 ENCOUNTER — Encounter: Payer: Self-pay | Admitting: Dermatology

## 2022-01-04 ENCOUNTER — Ambulatory Visit: Payer: Medicare HMO | Admitting: Dermatology

## 2022-01-04 ENCOUNTER — Encounter: Payer: Self-pay | Admitting: Dermatology

## 2022-01-04 DIAGNOSIS — L578 Other skin changes due to chronic exposure to nonionizing radiation: Secondary | ICD-10-CM

## 2022-01-04 DIAGNOSIS — L821 Other seborrheic keratosis: Secondary | ICD-10-CM | POA: Diagnosis not present

## 2022-01-04 DIAGNOSIS — L820 Inflamed seborrheic keratosis: Secondary | ICD-10-CM

## 2022-01-04 DIAGNOSIS — L570 Actinic keratosis: Secondary | ICD-10-CM

## 2022-01-04 NOTE — Patient Instructions (Addendum)
Cryotherapy Aftercare  Wash gently with soap and water everyday.   Apply Vaseline and Band-Aid daily until healed.    Recommend daily broad spectrum sunscreen SPF 30+ to sun-exposed areas, reapply every 2 hours as needed. Call for new or changing lesions.  Staying in the shade or wearing long sleeves, sun glasses (UVA+UVB protection) and wide brim hats (4-inch brim around the entire circumference of the hat) are also recommended for sun protection.    Due to recent changes in healthcare laws, you may see results of your pathology and/or laboratory studies on MyChart before the doctors have had a chance to review them. We understand that in some cases there may be results that are confusing or concerning to you. Please understand that not all results are received at the same time and often the doctors may need to interpret multiple results in order to provide you with the best plan of care or course of treatment. Therefore, we ask that you please give us 2 business days to thoroughly review all your results before contacting the office for clarification. Should we see a critical lab result, you will be contacted sooner.   If You Need Anything After Your Visit  If you have any questions or concerns for your doctor, please call our main line at [phone] and press option 4 to reach your doctor's medical assistant. If no one answers, please leave a voicemail as directed and we will return your call as soon as possible. Messages left after 4 pm will be answered the following business day.   You may also send us a message via MyChart. We typically respond to MyChart messages within 1-2 business days.  For prescription refills, please ask your pharmacy to contact our office. Our fax number is [phone].  If you have an urgent issue when the clinic is closed that cannot wait until the next business day, you can page your doctor at the number below.    Please note that while we do our best to be  available for urgent issues outside of office hours, we are not available 24/7.   If you have an urgent issue and are unable to reach us, you may choose to seek medical care at your doctor's office, retail clinic, urgent care center, or emergency room.  If you have a medical emergency, please immediately call 911 or go to the emergency department.  Pager Numbers  - Dr. Kowalski: [phone]  - Dr. Moye: [phone]  - Dr. Stewart: [phone]  In the event of inclement weather, please call our main line at [phone] for an update on the status of any delays or closures.  Dermatology Medication Tips: Please keep the boxes that topical medications come in in order to help keep track of the instructions about where and how to use these. Pharmacies typically print the medication instructions only on the boxes and not directly on the medication tubes.   If your medication is too expensive, please contact our office at [phone] option 4 or send us a message through MyChart.   We are unable to tell what your co-pay for medications will be in advance as this is different depending on your insurance coverage. However, we may be able to find a substitute medication at lower cost or fill out paperwork to get insurance to cover a needed medication.   If a prior authorization is required to get your medication covered by your insurance company, please allow us 1-2 business days to complete this process.    Drug prices often vary depending on where the prescription is filled and some pharmacies may offer cheaper prices.  The website www.goodrx.com contains coupons for medications through different pharmacies. The prices here do not account for what the cost may be with help from insurance (it may be cheaper with your insurance), but the website can give you the price if you did not use any insurance.  - You can print the associated coupon and take it with your prescription to the  pharmacy.  - You may also stop by our office during regular business hours and pick up a GoodRx coupon card.  - If you need your prescription sent electronically to a different pharmacy, notify our office through [place] MyChart or by phone at [phone] option 4.     Si Usted Necesita Algo Despus de Su Visita  Tambin puede enviarnos un mensaje a travs de MyChart. Por lo general respondemos a los mensajes de MyChart en el transcurso de 1 a 2 das hbiles.  Para renovar recetas, por favor pida a su farmacia que se ponga en contacto con nuestra oficina. Nuestro nmero de fax es el [phone].  Si tiene un asunto urgente cuando la clnica est cerrada y que no puede esperar hasta el siguiente da hbil, puede llamar/localizar a su doctor(a) al nmero que aparece a continuacin.   Por favor, tenga en cuenta que aunque hacemos todo lo posible para estar disponibles para asuntos urgentes fuera del horario de oficina, no estamos disponibles las 24 horas del da, los 7 das de la semana.   Si tiene un problema urgente y no puede comunicarse con nosotros, puede optar por buscar atencin mdica  en el consultorio de su doctor(a), en una clnica privada, en un centro de atencin urgente o en una sala de emergencias.  Si tiene una emergencia mdica, por favor llame inmediatamente al 911 o vaya a la sala de emergencias.  Nmeros de bper  - Dr. Kowalski: [phone]  - Dra. Moye: [phone]  - Dra. Stewart: [phone]  En caso de inclemencias del tiempo, por favor llame a nuestra lnea principal al [phone] para una actualizacin sobre el estado de cualquier retraso o cierre.  Consejos para la medicacin en dermatologa: Por favor, guarde las cajas en las que vienen los medicamentos de uso tpico para ayudarle a seguir las instrucciones sobre dnde y cmo usarlos. Las farmacias generalmente imprimen las instrucciones del medicamento slo en las cajas y no directamente en los  tubos del medicamento.   Si su medicamento es muy caro, por favor, pngase en contacto con nuestra oficina llamando al [phone] y presione la opcin 4 o envenos un mensaje a travs de MyChart.   No podemos decirle cul ser su copago por los medicamentos por adelantado ya que esto es diferente dependiendo de la cobertura de su seguro. Sin embargo, es posible que podamos encontrar un medicamento sustituto a menor costo o llenar un formulario para que el seguro cubra el medicamento que se considera necesario.   Si se requiere una autorizacin previa para que su compaa de seguros cubra su medicamento, por favor permtanos de 1 a 2 das hbiles para completar este proceso.  Los precios de los medicamentos varan con frecuencia dependiendo del lugar de dnde se surte la receta y alguna farmacias pueden ofrecer precios ms baratos.  El sitio web www.goodrx.com tiene cupones para medicamentos de diferentes farmacias. Los precios aqu no tienen en cuenta lo que podra costar con la ayuda del seguro (puede ser ms   barato con su seguro), pero el sitio web puede darle el precio si no utiliz ningn seguro.  - Puede imprimir el cupn correspondiente y llevarlo con su receta a la farmacia.  - Tambin puede pasar por nuestra oficina durante el horario de atencin regular y recoger una tarjeta de cupones de GoodRx.  - Si necesita que su receta se enve electrnicamente a una farmacia diferente, informe a nuestra oficina a travs de MyChart de [place] o por telfono llamando al [phone] y presione la opcin 4.  

## 2022-01-04 NOTE — Progress Notes (Unsigned)
Follow-Up Visit   Subjective  Chase George is a 88 y.o. male who presents for the following: Actinic Keratosis (6 month follow up. Face and ears. Tx with LN2 at last visit. Thinks has more on face today). The patient has spots, moles and lesions to be evaluated, some may be new or changing and the patient has concerns that these could be cancer.  The following portions of the chart were reviewed this encounter and updated as appropriate:  Tobacco  Allergies  Meds  Problems  Med Hx  Surg Hx  Fam Hx     Review of Systems: No other skin or systemic complaints except as noted in HPI or Assessment and Plan.  Objective  Well appearing patient in no apparent distress; mood and affect are within normal limits.  A focused examination was performed including face, scalp, ears, arms, hands. Relevant physical exam findings are noted in the Assessment and Plan.  Scalp and face x5, hands x2 (7) Erythematous keratotic or waxy stuck-on papule or plaque.  Scalp and face x5, hand 1 (6) Erythematous thin papules/macules with gritty scale.    Assessment & Plan  Inflamed seborrheic keratosis (7) Scalp and face x5, hands x2  Symptomatic, irritating, patient would like treated.  Destruction of lesion - Scalp and face x5, hands x2 Complexity: simple   Destruction method: cryotherapy   Informed consent: discussed and consent obtained   Timeout:  patient name, date of birth, surgical site, and procedure verified Lesion destroyed using liquid nitrogen: Yes   Region frozen until ice ball extended beyond lesion: Yes   Outcome: patient tolerated procedure well with no complications   Post-procedure details: wound care instructions given   Additional details:  Prior to procedure, discussed risks of blister formation, small wound, skin dyspigmentation, or rare scar following cryotherapy. Recommend Vaseline ointment to treated areas while healing.   AK (actinic keratosis) (6) Scalp and face x5, hand  1  Actinic keratoses are precancerous spots that appear secondary to cumulative UV radiation exposure/sun exposure over time. They are chronic with expected duration over 1 year. A portion of actinic keratoses will progress to squamous cell carcinoma of the skin. It is not possible to reliably predict which spots will progress to skin cancer and so treatment is recommended to prevent development of skin cancer.  Recommend daily broad spectrum sunscreen SPF 30+ to sun-exposed areas, reapply every 2 hours as needed.  Recommend staying in the shade or wearing long sleeves, sun glasses (UVA+UVB protection) and wide brim hats (4-inch brim around the entire circumference of the hat). Call for new or changing lesions.  Destruction of lesion - Scalp and face x5, hand 1 Complexity: simple   Destruction method: cryotherapy   Informed consent: discussed and consent obtained   Timeout:  patient name, date of birth, surgical site, and procedure verified Lesion destroyed using liquid nitrogen: Yes   Region frozen until ice ball extended beyond lesion: Yes   Outcome: patient tolerated procedure well with no complications   Post-procedure details: wound care instructions given   Additional details:  Prior to procedure, discussed risks of blister formation, small wound, skin dyspigmentation, or rare scar following cryotherapy. Recommend Vaseline ointment to treated areas while healing.   Actinic Damage - chronic, secondary to cumulative UV radiation exposure/sun exposure over time - diffuse scaly erythematous macules with underlying dyspigmentation - Recommend daily broad spectrum sunscreen SPF 30+ to sun-exposed areas, reapply every 2 hours as needed.  - Recommend staying in the shade or  wearing long sleeves, sun glasses (UVA+UVB protection) and wide brim hats (4-inch brim around the entire circumference of the hat). - Call for new or changing lesions.  Seborrheic Keratoses - Stuck-on, waxy, tan-brown  papules and/or plaques  - Benign-appearing - Discussed benign etiology and prognosis. - Observe - Call for any changes  Return for AK Follow Up in 6-12 months.  I, Emelia Salisbury, CMA, am acting as scribe for Sarina Ser, MD. Documentation: I have reviewed the above documentation for accuracy and completeness, and I agree with the above.  Sarina Ser, MD

## 2022-01-05 ENCOUNTER — Encounter: Payer: Self-pay | Admitting: Dermatology

## 2022-07-13 ENCOUNTER — Encounter: Payer: Self-pay | Admitting: Dermatology

## 2022-07-13 ENCOUNTER — Ambulatory Visit: Payer: Medicare HMO | Admitting: Dermatology

## 2022-07-13 VITALS — BP 152/76 | HR 64

## 2022-07-13 DIAGNOSIS — L570 Actinic keratosis: Secondary | ICD-10-CM | POA: Diagnosis not present

## 2022-07-13 DIAGNOSIS — L821 Other seborrheic keratosis: Secondary | ICD-10-CM

## 2022-07-13 DIAGNOSIS — L820 Inflamed seborrheic keratosis: Secondary | ICD-10-CM

## 2022-07-13 DIAGNOSIS — L578 Other skin changes due to chronic exposure to nonionizing radiation: Secondary | ICD-10-CM | POA: Diagnosis not present

## 2022-07-13 NOTE — Patient Instructions (Addendum)
Cryotherapy Aftercare  Wash gently with soap and water everyday.   Apply Vaseline and Band-Aid daily until healed.     Recommend daily broad spectrum sunscreen SPF 30+ to sun-exposed areas, reapply every 2 hours as needed. Call for new or changing lesions.  Staying in the shade or wearing long sleeves, sun glasses (UVA+UVB protection) and wide brim hats (4-inch brim around the entire circumference of the hat) are also recommended for sun protection.     Due to recent changes in healthcare laws, you may see results of your pathology and/or laboratory studies on MyChart before the doctors have had a chance to review them. We understand that in some cases there may be results that are confusing or concerning to you. Please understand that not all results are received at the same time and often the doctors may need to interpret multiple results in order to provide you with the best plan of care or course of treatment. Therefore, we ask that you please give [place] 2 business days to thoroughly review all your results before contacting the office for clarification. Should we see a critical lab result, you will be contacted sooner.   If You Need Anything After Your Visit  If you have any questions or concerns for your doctor, please call our main line at [phone] and press option 4 to reach your doctor's medical assistant. If no one answers, please leave a voicemail as directed and we will return your call as soon as possible. Messages left after 4 pm will be answered the following business day.   You may also send [place] a message via [place]. We typically respond to MyChart messages within 1-2 business days.  For prescription refills, please ask your pharmacy to contact our office. Our fax number is [phone].  If you have an urgent issue when the clinic is closed that cannot wait until the next business day, you can page your doctor at the number below.    Please note that while we do our best to  be available for urgent issues outside of office hours, we are not available 24/7.   If you have an urgent issue and are unable to reach [place], you may choose to seek medical care at your doctor's office, retail clinic, urgent care center, or emergency room.  If you have a medical emergency, please immediately call 911 or go to the emergency department.  Pager Numbers  - Dr. Nehemiah Massed: [phone]  - Dr. Laurence Ferrari: [phone]  - Dr. Nicole Kindred: [phone]  In the event of inclement weather, please call our main line at [phone] for an update on the status of any delays or closures.  Dermatology Medication Tips: Please keep the boxes that topical medications come in in order to help keep track of the instructions about where and how to use these. Pharmacies typically print the medication instructions only on the boxes and not directly on the medication tubes.   If your medication is too expensive, please contact our office at [phone] option 4 or send [place] a message through [place].   We are unable to tell what your co-pay for medications will be in advance as this is different depending on your insurance coverage. However, we may be able to find a substitute medication at lower cost or fill out paperwork to get insurance to cover a needed medication.   If a prior authorization is required to get your medication covered by your insurance company, please allow [place] 1-2 business days to complete this  process.  Drug prices often vary depending on where the prescription is filled and some pharmacies may offer cheaper prices.  The website www.goodrx.com contains coupons for medications through different pharmacies. The prices here do not account for what the cost may be with help from insurance (it may be cheaper with your insurance), but the website can give you the price if you did not use any insurance.  - You can print the associated coupon and take it with your prescription to the pharmacy.   - You may also stop by our office during regular business hours and pick up a GoodRx coupon card.  - If you need your prescription sent electronically to a different pharmacy, notify our office through [facility] or by phone at [phone] option 4.     Si Usted Necesita Algo Despus de Su Visita  Tambin puede enviarnos un mensaje a travs de Pharmacist, community. Por lo general respondemos a los mensajes de MyChart en el transcurso de 1 a 2 das hbiles.  Para renovar recetas, por favor pida a su farmacia que se ponga en contacto con nuestra oficina. Harland Dingwall de fax es [place] [phone].  Si tiene un asunto urgente cuando la clnica est cerrada y que no puede esperar hasta el siguiente da hbil, puede llamar/localizar a su doctor(a) al nmero que aparece a continuacin.   Por favor, tenga en cuenta que aunque hacemos todo lo posible para estar disponibles para asuntos urgentes fuera del horario de [place], no estamos disponibles las 24 horas del da, los 7 das de la [place].   Si tiene un problema urgente y no puede comunicarse con nosotros, puede optar por buscar atencin mdica  en el consultorio de su doctor(a), en una clnica privada, en un centro de atencin urgente o en una sala de emergencias.  Si tiene Engineering geologist, por favor llame inmediatamente al 911 o vaya a la sala de emergencias.  Nmeros de bper  - Dr. Nehemiah Massed: [phone]  - Dra. Moye: [phone]  - Dra. Nicole Kindred: [phone]  En caso de inclemencias del [place], por favor llame a Johnsie Kindred principal al [phone] para una actualizacin sobre el [place] de cualquier retraso o cierre.  Consejos para la medicacin en dermatologa: Por favor, guarde las cajas en las que vienen los medicamentos de uso tpico para ayudarle a seguir las instrucciones sobre dnde y cmo usarlos. Las farmacias generalmente imprimen las instrucciones del medicamento slo en las cajas y no directamente en los tubos del  [place].   Si su medicamento es muy caro, por favor, pngase en contacto con Zigmund Daniel llamando al [phone] y presione la opcin 4 o envenos un mensaje a travs de Pharmacist, community.   No podemos decirle cul ser su copago por los medicamentos por adelantado ya que esto es diferente dependiendo de la cobertura de su seguro. Sin embargo, es posible que podamos encontrar un medicamento sustituto a Electrical engineer un formulario para que el seguro cubra el medicamento que se considera necesario.   Si se requiere una autorizacin previa para que su compaa de seguros [place] su medicamento, por favor permtanos de 1 a 2 das hbiles para completar este proceso.  Los precios de los medicamentos varan con frecuencia dependiendo del Environmental consultant de dnde se surte la receta y alguna farmacias pueden ofrecer precios ms baratos.  El sitio web www.goodrx.com tiene cupones para medicamentos de Airline pilot. Los precios aqu no tienen en cuenta lo que podra costar con la ayuda del seguro (puede  barato con su seguro), pero el sitio web puede darle el precio si no utiliz ningn seguro.  - Puede imprimir el cupn correspondiente y llevarlo con su receta a la farmacia.  - Tambin puede pasar por nuestra oficina durante el horario de atencin regular y recoger una tarjeta de cupones de GoodRx.  - Si necesita que su receta se enve electrnicamente a una farmacia diferente, informe a nuestra oficina a travs de MyChart de [place] o por telfono llamando al [phone] y presione la opcin 4.  

## 2022-07-13 NOTE — Progress Notes (Signed)
Follow-Up Visit   Subjective  Chase George is a 89 y.o. male who presents for the following: Actinic Keratosis (6 month follow up. Scalp, face, hands). The patient has spots, moles and lesions to be evaluated, some may be new or changing and the patient has concerns that these could be cancer.  The following portions of the chart were reviewed this encounter and updated as appropriate:  Tobacco  Allergies  Meds  Problems  Med Hx  Surg Hx  Fam Hx     Review of Systems: No other skin or systemic complaints except as noted in HPI or Assessment and Plan.  Objective  Well appearing patient in no apparent distress; mood and affect are within normal limits.  A focused examination was performed including head, neck, hands, chest. Relevant physical exam findings are noted in the Assessment and Plan.  Scalp, ears, face x18 (18) Erythematous thin papules/macules with gritty scale.   Right Chest x1 Erythematous keratotic or waxy stuck-on papule or plaque.   Assessment & Plan  AK (actinic keratosis) (18) Scalp, ears, face x18  Actinic keratoses are precancerous spots that appear secondary to cumulative UV radiation exposure/sun exposure over time. They are chronic with expected duration over 1 year. A portion of actinic keratoses will progress to squamous cell carcinoma of the skin. It is not possible to reliably predict which spots will progress to skin cancer and so treatment is recommended to prevent development of skin cancer.  Recommend daily broad spectrum sunscreen SPF 30+ to sun-exposed areas, reapply every 2 hours as needed.  Recommend staying in the shade or wearing long sleeves, sun glasses (UVA+UVB protection) and wide brim hats (4-inch brim around the entire circumference of the hat). Call for new or changing lesions.  Destruction of lesion - Scalp, ears, face x18 Complexity: simple   Destruction method: cryotherapy   Informed consent: discussed and consent obtained    Timeout:  patient name, date of birth, surgical site, and procedure verified Lesion destroyed using liquid nitrogen: Yes   Region frozen until ice ball extended beyond lesion: Yes   Outcome: patient tolerated procedure well with no complications   Post-procedure details: wound care instructions given   Additional details:  Prior to procedure, discussed risks of blister formation, small wound, skin dyspigmentation, or rare scar following cryotherapy. Recommend Vaseline ointment to treated areas while healing.   Inflamed seborrheic keratosis Right Chest x1  Symptomatic, irritating, patient would like treated.  Destruction of lesion - Right Chest x1 Complexity: simple   Destruction method: cryotherapy   Informed consent: discussed and consent obtained   Timeout:  patient name, date of birth, surgical site, and procedure verified Lesion destroyed using liquid nitrogen: Yes   Region frozen until ice ball extended beyond lesion: Yes   Outcome: patient tolerated procedure well with no complications   Post-procedure details: wound care instructions given   Additional details:  Prior to procedure, discussed risks of blister formation, small wound, skin dyspigmentation, or rare scar following cryotherapy. Recommend Vaseline ointment to treated areas while healing.   Actinic Damage - chronic, secondary to cumulative UV radiation exposure/sun exposure over time - diffuse scaly erythematous macules with underlying dyspigmentation - Recommend daily broad spectrum sunscreen SPF 30+ to sun-exposed areas, reapply every 2 hours as needed.  - Recommend staying in the shade or wearing long sleeves, sun glasses (UVA+UVB protection) and wide brim hats (4-inch brim around the entire circumference of the hat). - Call for new or changing lesions.   Seborrheic  Keratoses - Stuck-on, waxy, tan-brown papules and/or plaques  - Benign-appearing - Discussed benign etiology and prognosis. - Observe - Call for  any changes  Return in about 6 months (around 01/13/2023) for AK Follow Up, UBSE.  I, Emelia Salisbury, CMA, am acting as scribe for Sarina Ser, MD. Documentation: I have reviewed the above documentation for accuracy and completeness, and I agree with the above.  Sarina Ser, MD

## 2022-07-17 ENCOUNTER — Encounter: Payer: Self-pay | Admitting: Dermatology

## 2023-01-18 ENCOUNTER — Encounter: Payer: Self-pay | Admitting: Dermatology

## 2023-01-18 ENCOUNTER — Ambulatory Visit: Payer: Medicare HMO | Admitting: Dermatology

## 2023-01-18 VITALS — BP 160/76 | HR 63

## 2023-01-18 DIAGNOSIS — L578 Other skin changes due to chronic exposure to nonionizing radiation: Secondary | ICD-10-CM

## 2023-01-18 DIAGNOSIS — L570 Actinic keratosis: Secondary | ICD-10-CM | POA: Diagnosis not present

## 2023-01-18 DIAGNOSIS — Z1283 Encounter for screening for malignant neoplasm of skin: Secondary | ICD-10-CM

## 2023-01-18 DIAGNOSIS — D229 Melanocytic nevi, unspecified: Secondary | ICD-10-CM

## 2023-01-18 DIAGNOSIS — Z85828 Personal history of other malignant neoplasm of skin: Secondary | ICD-10-CM

## 2023-01-18 DIAGNOSIS — L821 Other seborrheic keratosis: Secondary | ICD-10-CM

## 2023-01-18 DIAGNOSIS — L814 Other melanin hyperpigmentation: Secondary | ICD-10-CM

## 2023-01-18 DIAGNOSIS — L820 Inflamed seborrheic keratosis: Secondary | ICD-10-CM

## 2023-01-18 DIAGNOSIS — W908XXA Exposure to other nonionizing radiation, initial encounter: Secondary | ICD-10-CM

## 2023-01-18 DIAGNOSIS — D1801 Hemangioma of skin and subcutaneous tissue: Secondary | ICD-10-CM

## 2023-01-18 DIAGNOSIS — Z8589 Personal history of malignant neoplasm of other organs and systems: Secondary | ICD-10-CM

## 2023-01-18 NOTE — Progress Notes (Signed)
Follow-Up Visit   Subjective  Chase George is a 89 y.o. male who presents for the following: Skin Cancer Screening and Upper Body Skin Exam - History of SCC  The patient presents for Upper Body Skin Exam (UBSE) for skin cancer screening and mole check. The patient has spots, moles and lesions to be evaluated, some may be new or changing and the patient may have concern these could be cancer.   The following portions of the chart were reviewed this encounter and updated as appropriate: medications, allergies, medical history  Review of Systems:  No other skin or systemic complaints except as noted in HPI or Assessment and Plan.  Objective  Well appearing patient in no apparent distress; mood and affect are within normal limits.  All skin waist up examined. Relevant physical exam findings are noted in the Assessment and Plan.  Scalp x 1, R cheek x 2, L nose x 2, L cheek x 2 (7) Erythematous thin papules/macules with gritty scale.   L temple x 1 Erythematous stuck-on, waxy papule or plaque    Assessment & Plan   AK (actinic keratosis) (7) Scalp x 1, R cheek x 2, L nose x 2, L cheek x 2  Actinic keratoses are precancerous spots that appear secondary to cumulative UV radiation exposure/sun exposure over time. They are chronic with expected duration over 1 year. A portion of actinic keratoses will progress to squamous cell carcinoma of the skin. It is not possible to reliably predict which spots will progress to skin cancer and so treatment is recommended to prevent development of skin cancer.  Recommend daily broad spectrum sunscreen SPF 30+ to sun-exposed areas, reapply every 2 hours as needed.  Recommend staying in the shade or wearing long sleeves, sun glasses (UVA+UVB protection) and wide brim hats (4-inch brim around the entire circumference of the hat). Call for new or changing lesions.   Destruction of lesion - Scalp x 1, R cheek x 2, L nose x 2, L cheek x 2 (7) Complexity:  simple   Destruction method: cryotherapy   Informed consent: discussed and consent obtained   Timeout:  patient name, date of birth, surgical site, and procedure verified Lesion destroyed using liquid nitrogen: Yes   Region frozen until ice ball extended beyond lesion: Yes   Outcome: patient tolerated procedure well with no complications   Post-procedure details: wound care instructions given    Inflamed seborrheic keratosis L temple x 1  Symptomatic, irritating, patient would like treated.   Destruction of lesion - L temple x 1 Complexity: simple   Destruction method: cryotherapy   Informed consent: discussed and consent obtained   Timeout:  patient name, date of birth, surgical site, and procedure verified Lesion destroyed using liquid nitrogen: Yes   Region frozen until ice ball extended beyond lesion: Yes   Outcome: patient tolerated procedure well with no complications   Post-procedure details: wound care instructions given     Skin cancer screening performed today.  Actinic Damage - Chronic condition, secondary to cumulative UV/sun exposure - diffuse scaly erythematous macules with underlying dyspigmentation - Recommend daily broad spectrum sunscreen SPF 30+ to sun-exposed areas, reapply every 2 hours as needed.  - Staying in the shade or wearing long sleeves, sun glasses (UVA+UVB protection) and wide brim hats (4-inch brim around the entire circumference of the hat) are also recommended for sun protection.  - Call for new or changing lesions.  Lentigines, Seborrheic Keratoses, Hemangiomas - Benign normal skin lesions -  Benign-appearing - Call for any changes  Melanocytic Nevi - Tan-brown and/or pink-flesh-colored symmetric macules and papules - Benign appearing on exam today - Observation - Call clinic for new or changing moles - Recommend daily use of broad spectrum spf 30+ sunscreen to sun-exposed areas.   HISTORY OF SQUAMOUS CELL CARCINOMA OF THE SKIN - No  evidence of recurrence today - No lymphadenopathy - Recommend regular full body skin exams - Recommend daily broad spectrum sunscreen SPF 30+ to sun-exposed areas, reapply every 2 hours as needed.  - Call if any new or changing lesions are noted between office visits  Return in about 6 months (around 07/18/2023) for AK follow up .  I, Joanie Coddington, CMA, am acting as scribe for Armida Sans, MD .   Documentation: I have reviewed the above documentation for accuracy and completeness, and I agree with the above.  Armida Sans, MD

## 2023-01-18 NOTE — Patient Instructions (Signed)

## 2023-07-27 ENCOUNTER — Ambulatory Visit: Payer: Medicare HMO | Admitting: Dermatology

## 2023-08-10 ENCOUNTER — Encounter: Payer: Self-pay | Admitting: Dermatology

## 2023-08-10 ENCOUNTER — Ambulatory Visit: Admitting: Dermatology

## 2023-08-10 DIAGNOSIS — W908XXA Exposure to other nonionizing radiation, initial encounter: Secondary | ICD-10-CM | POA: Diagnosis not present

## 2023-08-10 DIAGNOSIS — L820 Inflamed seborrheic keratosis: Secondary | ICD-10-CM

## 2023-08-10 DIAGNOSIS — L578 Other skin changes due to chronic exposure to nonionizing radiation: Secondary | ICD-10-CM | POA: Diagnosis not present

## 2023-08-10 DIAGNOSIS — L720 Epidermal cyst: Secondary | ICD-10-CM

## 2023-08-10 DIAGNOSIS — L570 Actinic keratosis: Secondary | ICD-10-CM | POA: Diagnosis not present

## 2023-08-10 DIAGNOSIS — L729 Follicular cyst of the skin and subcutaneous tissue, unspecified: Secondary | ICD-10-CM

## 2023-08-10 NOTE — Patient Instructions (Addendum)

## 2023-08-10 NOTE — Progress Notes (Signed)
 Follow-Up Visit   Subjective  Chase George is a 89 y.o. male who presents for the following: AK 19m f/u, face, scalp The patient has spots, moles and lesions to be evaluated, some may be new or changing and the patient may have concern these could be cancer.  The following portions of the chart were reviewed this encounter and updated as appropriate: medications, allergies, medical history  Review of Systems:  No other skin or systemic complaints except as noted in HPI or Assessment and Plan.  Objective  Well appearing patient in no apparent distress; mood and affect are within normal limits.  A focused examination was performed of the following areas: Face, scalp  Relevant exam findings are noted in the Assessment and Plan.  scalp, ears, face x 18 (18) Pink scaly macules face x 17 (17) Stuck on waxy paps with erythema  Assessment & Plan   ACTINIC DAMAGE - chronic, secondary to cumulative UV radiation exposure/sun exposure over time - diffuse scaly erythematous macules with underlying dyspigmentation - Recommend daily broad spectrum sunscreen SPF 30+ to sun-exposed areas, reapply every 2 hours as needed.  - Recommend staying in the shade or wearing long sleeves, sun glasses (UVA+UVB protection) and wide brim hats (4-inch brim around the entire circumference of the hat). - Call for new or changing lesions.   AK (ACTINIC KERATOSIS) (18) scalp, ears, face x 18 (18) Actinic keratoses are precancerous spots that appear secondary to cumulative UV radiation exposure/sun exposure over time. They are chronic with expected duration over 1 year. A portion of actinic keratoses will progress to squamous cell carcinoma of the skin. It is not possible to reliably predict which spots will progress to skin cancer and so treatment is recommended to prevent development of skin cancer.  Recommend daily broad spectrum sunscreen SPF 30+ to sun-exposed areas, reapply every 2 hours as needed.  Recommend  staying in the shade or wearing long sleeves, sun glasses (UVA+UVB protection) and wide brim hats (4-inch brim around the entire circumference of the hat). Call for new or changing lesions. Destruction of lesion - scalp, ears, face x 18 (18) Complexity: simple   Destruction method: cryotherapy   Informed consent: discussed and consent obtained   Timeout:  patient name, date of birth, surgical site, and procedure verified Lesion destroyed using liquid nitrogen: Yes   Region frozen until ice ball extended beyond lesion: Yes   Outcome: patient tolerated procedure well with no complications   Post-procedure details: wound care instructions given   INFLAMED SEBORRHEIC KERATOSIS (17) face x 17 (17) Symptomatic, irritating, patient would like treated. Destruction of lesion - face x 17 (17) Complexity: simple   Destruction method: cryotherapy   Informed consent: discussed and consent obtained   Timeout:  patient name, date of birth, surgical site, and procedure verified Lesion destroyed using liquid nitrogen: Yes   Region frozen until ice ball extended beyond lesion: Yes   Outcome: patient tolerated procedure well with no complications   Post-procedure details: wound care instructions given    EPIDERMAL INCLUSION CYST L post scalp Exam: Subcutaneous nodule 3.63mm at L post scalp  Benign-appearing. Exam most consistent with an epidermal inclusion cyst. Discussed that a cyst is a benign growth that can grow over time and sometimes get irritated or inflamed. Recommend observation if it is not bothersome. Discussed option of surgical excision to remove it if it is growing, symptomatic, or other changes noted. Please call for new or changing lesions so they can be evaluated.  Return in about 6 months (around 02/09/2024) for UBSE, Hx of AKs, Hx of SCC.  I, Ardis Rowan, RMA, am acting as scribe for Armida Sans, MD .   Documentation: I have reviewed the above documentation for accuracy and  completeness, and I agree with the above.  Armida Sans, MD

## 2024-02-21 ENCOUNTER — Ambulatory Visit: Admitting: Dermatology

## 2024-02-21 ENCOUNTER — Encounter: Payer: Self-pay | Admitting: Dermatology

## 2024-02-21 DIAGNOSIS — T148XXA Other injury of unspecified body region, initial encounter: Secondary | ICD-10-CM

## 2024-02-21 DIAGNOSIS — L700 Acne vulgaris: Secondary | ICD-10-CM

## 2024-02-21 DIAGNOSIS — D229 Melanocytic nevi, unspecified: Secondary | ICD-10-CM

## 2024-02-21 DIAGNOSIS — L570 Actinic keratosis: Secondary | ICD-10-CM | POA: Diagnosis not present

## 2024-02-21 DIAGNOSIS — Z7189 Other specified counseling: Secondary | ICD-10-CM

## 2024-02-21 DIAGNOSIS — L821 Other seborrheic keratosis: Secondary | ICD-10-CM | POA: Diagnosis not present

## 2024-02-21 DIAGNOSIS — L578 Other skin changes due to chronic exposure to nonionizing radiation: Secondary | ICD-10-CM | POA: Diagnosis not present

## 2024-02-21 DIAGNOSIS — L814 Other melanin hyperpigmentation: Secondary | ICD-10-CM

## 2024-02-21 DIAGNOSIS — Z1283 Encounter for screening for malignant neoplasm of skin: Secondary | ICD-10-CM | POA: Diagnosis not present

## 2024-02-21 DIAGNOSIS — Z8589 Personal history of malignant neoplasm of other organs and systems: Secondary | ICD-10-CM

## 2024-02-21 DIAGNOSIS — S00412A Abrasion of left ear, initial encounter: Secondary | ICD-10-CM

## 2024-02-21 DIAGNOSIS — D1801 Hemangioma of skin and subcutaneous tissue: Secondary | ICD-10-CM

## 2024-02-21 DIAGNOSIS — Z79899 Other long term (current) drug therapy: Secondary | ICD-10-CM

## 2024-02-21 DIAGNOSIS — W908XXA Exposure to other nonionizing radiation, initial encounter: Secondary | ICD-10-CM

## 2024-02-21 DIAGNOSIS — Z85828 Personal history of other malignant neoplasm of skin: Secondary | ICD-10-CM

## 2024-02-21 NOTE — Patient Instructions (Addendum)
 Cryotherapy Aftercare  Wash gently with soap and water everyday.   Apply Vaseline and Band-Aid daily until healed.   Excoriation left ear, if not resolved in 2 months call the office to have recheckd   For acne bump between eyebrows start Neutrogena Stubborn acne treatment in the morning until resolved.   Due to recent changes in healthcare laws, you may see results of your pathology and/or laboratory studies on MyChart before the doctors have had a chance to review them. We understand that in some cases there may be results that are confusing or concerning to you. Please understand that not all results are received at the same time and often the doctors may need to interpret multiple results in order to provide you with the best plan of care or course of treatment. Therefore, we ask that you please give us  2 business days to thoroughly review all your results before contacting the office for clarification. Should we see a critical lab result, you will be contacted sooner.   If You Need Anything After Your Visit  If you have any questions or concerns for your doctor, please call our main line at [phone] and press option 4 to reach your doctor's medical assistant. If no one answers, please leave a voicemail as directed and we will return your call as soon as possible. Messages left after 4 pm will be answered the following business day.   You may also send us  a message via MyChart. We typically respond to MyChart messages within 1-2 business days.  For prescription refills, please ask your pharmacy to contact our office. Our fax number is [phone].  If you have an urgent issue when the clinic is closed that cannot wait until the next business day, you can page your doctor at the number below.    Please note that while we do our best to be available for urgent issues outside of office hours, we are not available 24/7.   If you have an urgent issue and are unable to reach us , you may  choose to seek medical care at your doctor's office, retail clinic, urgent care center, or emergency room.  If you have a medical emergency, please immediately call 911 or go to the emergency department.  Pager Numbers  - Dr. Hester: [phone]  - Dr. Jackquline: [phone]  - Dr. Claudene: [phone]   - Dr. Raymund: [phone]  In the event of inclement weather, please call our main line at [phone] for an update on the status of any delays or closures.  Dermatology Medication Tips: Please keep the boxes that topical medications come in in order to help keep track of the instructions about where and how to use these. Pharmacies typically print the medication instructions only on the boxes and not directly on the medication tubes.   If your medication is too expensive, please contact our office at [phone] option 4 or send us  a message through MyChart.   We are unable to tell what your co-pay for medications will be in advance as this is different depending on your insurance coverage. However, we may be able to find a substitute medication at lower cost or fill out paperwork to get insurance to cover a needed medication.   If a prior authorization is required to get your medication covered by your insurance company, please allow us  1-2 business days to complete this process.  Drug prices often vary depending on where the prescription is filled and some pharmacies may offer cheaper prices.  The website www.goodrx.com contains coupons for medications through different pharmacies. The prices here do not account for what the cost may be with help from insurance (it may be cheaper with your insurance), but the website can give you the price if you did not use any insurance.  - You can print the associated coupon and take it with your prescription to the pharmacy.  - You may also stop by our office during regular business hours and pick up a GoodRx coupon card.  - If you need your  prescription sent electronically to a different pharmacy, notify our office through [facility] or by phone at [phone] option 4.     Si Usted Necesita Algo Despus de Su Visita  Tambin puede enviarnos un mensaje a travs de Clinical cytogeneticist. Por lo general respondemos a los mensajes de MyChart en el transcurso de 1 a 2 das hbiles.  Para renovar recetas, por favor pida a su farmacia que se ponga en contacto con nuestra oficina. Randi lakes de fax es [place] [phone].  Si tiene un asunto urgente cuando la clnica est cerrada y que no puede esperar hasta el siguiente da hbil, puede llamar/localizar a su doctor(a) al nmero que aparece a continuacin.   Por favor, tenga en cuenta que aunque hacemos todo lo posible para estar disponibles para asuntos urgentes fuera del horario de [place], no estamos disponibles las 24 horas del da, los 7 [address]  [address] de la [place].   Si tiene un problema urgente y no puede comunicarse con nosotros, puede optar por buscar atencin mdica  en el consultorio de su doctor(a), en una clnica privada, en un centro de atencin urgente o en una sala de emergencias.  Si tiene Engineer, drilling, por favor llame inmediatamente al 911 o vaya a la sala de emergencias.  Nmeros de bper  - Dr. Hester: [phone]  - Dra. Jackquline: [phone]  - Dr. Claudene: [phone]  - Dra. Kitts: [phone]  En caso de inclemencias del [place], por favor llame a nuestra lnea principal al [phone] para una actualizacin sobre el estado de cualquier retraso o cierre.  Consejos para la medicacin en dermatologa: Por favor, guarde las cajas en las que vienen los medicamentos de uso tpico para ayudarle a seguir las instrucciones sobre dnde y cmo usarlos. Las farmacias generalmente imprimen las instrucciones del medicamento slo en las cajas y no directamente en los tubos del [place].   Si su medicamento es muy caro, por favor, pngase en contacto con landry rieger llamando al [phone] y presione la opcin 4 o envenos un mensaje a travs de Clinical cytogeneticist.   No podemos decirle cul ser su copago por los medicamentos por adelantado ya que esto es diferente dependiendo de la cobertura de su seguro. Sin embargo, es posible que podamos encontrar un medicamento sustituto a Audiological scientist un formulario para que el seguro cubra el medicamento que se considera necesario.   Si se requiere una autorizacin previa para que su compaa de seguros malta su medicamento, por favor permtanos de 1 a 2 das hbiles para completar este proceso.  Los precios de los medicamentos varan con frecuencia dependiendo del Environmental consultant de dnde se surte la receta y alguna farmacias pueden ofrecer precios ms baratos.  El sitio web www.goodrx.com tiene cupones para medicamentos de Health and safety inspector. Los precios aqu no tienen en cuenta lo que podra costar con la ayuda del seguro (puede ser ms barato con su seguro), pero el sitio web puede darle el precio si no  utiliz ningn seguro.  - Puede imprimir el cupn correspondiente y llevarlo con su receta a la farmacia.  - Tambin puede pasar por nuestra oficina durante el horario de atencin regular y Education officer, museum una tarjeta de cupones de GoodRx.  - Si necesita que su receta se enve electrnicamente a una farmacia diferente, informe a nuestra oficina a travs de MyChart de [place] o por telfono llamando al [phone] y presione la opcin 4.

## 2024-02-21 NOTE — Progress Notes (Signed)
 Follow-Up Visit   Subjective  Chase George is a 89 y.o. male who presents for the following: Skin Cancer Screening and Full Body Skin Exam hx of SCC, AKs  The patient presents for Total-Body Skin Exam (TBSE) for skin cancer screening and mole check. The patient has spots, moles and lesions to be evaluated, some may be new or changing and the patient may have concern these could be cancer.  The following portions of the chart were reviewed this encounter and updated as appropriate: medications, allergies, medical history  Review of Systems:  No other skin or systemic complaints except as noted in HPI or Assessment and Plan.  Objective  Well appearing patient in no apparent distress; mood and affect are within normal limits.  A full examination was performed including scalp, head, eyes, ears, nose, lips, neck, chest, axillae, abdomen, back, buttocks, bilateral upper extremities, bilateral lower extremities, hands, feet, fingers, toes, fingernails, and toenails. All findings within normal limits unless otherwise noted below.   Relevant physical exam findings are noted in the Assessment and Plan.  scalp, face, ears x 15 (15) Pink scaly macules  L ear   Assessment & Plan   SKIN CANCER SCREENING PERFORMED TODAY.  ACTINIC DAMAGE - Chronic condition, secondary to cumulative UV/sun exposure - diffuse scaly erythematous macules with underlying dyspigmentation - Recommend daily broad spectrum sunscreen SPF 30+ to sun-exposed areas, reapply every 2 hours as needed.  - Staying in the shade or wearing long sleeves, sun glasses (UVA+UVB protection) and wide brim hats (4-inch brim around the entire circumference of the hat) are also recommended for sun protection.  - Call for new or changing lesions.  LENTIGINES, SEBORRHEIC KERATOSES, HEMANGIOMAS - Benign normal skin lesions - Benign-appearing - Call for any changes  MELANOCYTIC NEVI - Tan-brown and/or pink-flesh-colored symmetric macules  and papules - Benign appearing on exam today - Observation - Call clinic for new or changing moles - Recommend daily use of broad spectrum spf 30+ sunscreen to sun-exposed areas.   HISTORY OF SQUAMOUS CELL CARCINOMA OF THE SKIN - No evidence of recurrence today - No lymphadenopathy - Recommend regular full body skin exams - Recommend daily broad spectrum sunscreen SPF 30+ to sun-exposed areas, reapply every 2 hours as needed.  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 Condition is bothersome/symptomatic for patient. Currently flared. Treatment Plan: Start Neutrogena Stubborn Acne Treatment qam, sample x 1 given   EXCORIATION? L ear Exam: Excoriation at L ear, see photo Treatment Plan: Recommend vaseline. Call for an up ointment to examine if not resolved in 2 months.  AK (ACTINIC KERATOSIS) (15) scalp, face, ears x 15 (15) Actinic keratoses are precancerous spots that appear secondary to cumulative UV radiation exposure/sun exposure over time. They are chronic with expected duration over 1 year. A portion of actinic keratoses will progress to squamous cell carcinoma of the skin. It is not possible to reliably predict which spots will progress to skin cancer and so treatment is recommended to prevent development of skin cancer.  Recommend daily broad spectrum sunscreen SPF 30+ to sun-exposed areas, reapply every 2 hours as needed.  Recommend staying in the shade or wearing long sleeves, sun glasses (UVA+UVB protection) and wide brim hats (4-inch brim around the entire circumference of the hat). Call for new or changing lesions. Destruction  of lesion - scalp, face, ears x 15 (15) Complexity: simple   Destruction method: cryotherapy   Informed consent: discussed and consent obtained    Timeout:  patient name, date of birth, surgical site, and procedure verified Lesion destroyed using liquid nitrogen: Yes   Region frozen until ice ball extended beyond lesion: Yes   Outcome: patient tolerated procedure well with no complications   Post-procedure details: wound care instructions given    Return in about 1 year (around 02/20/2025) for UBSE, Hx of SCC, Hx of AKs.  I, Grayce Saunas, RMA, am acting as scribe for Alm Rhyme, MD .   Documentation: I have reviewed the above documentation for accuracy and completeness, and I agree with the above.  Alm Rhyme, MD

## 2024-03-20 ENCOUNTER — Ambulatory Visit: Admitting: Pediatrics

## 2024-06-27 ENCOUNTER — Ambulatory Visit: Admitting: Nurse Practitioner

## 2025-02-26 ENCOUNTER — Ambulatory Visit: Admitting: Dermatology
